# Patient Record
Sex: Male | Born: 1985 | Race: Black or African American | Hispanic: No | Marital: Married | Smoking: Never smoker
Health system: Southern US, Community
[De-identification: ages and names within clinical notes are randomized; demographics above are authoritative.]

## PROBLEM LIST (undated history)

## (undated) HISTORY — PX: ANTERIOR CRUCIATE LIGAMENT REPAIR: SHX115

## (undated) HISTORY — PX: JOINT REPLACEMENT: SHX530

---

## 2007-11-28 ENCOUNTER — Emergency Department: Payer: Self-pay | Admitting: Emergency Medicine

## 2007-12-01 ENCOUNTER — Ambulatory Visit: Payer: Self-pay | Admitting: Specialist

## 2007-12-06 ENCOUNTER — Ambulatory Visit: Payer: Self-pay | Admitting: Specialist

## 2015-04-23 ENCOUNTER — Encounter: Payer: Self-pay | Admitting: Physician Assistant

## 2015-04-23 ENCOUNTER — Ambulatory Visit: Payer: Self-pay | Admitting: Physician Assistant

## 2015-04-23 VITALS — BP 120/80 | HR 82 | Temp 97.7°F | Ht 68.0 in | Wt 239.0 lb

## 2015-04-23 DIAGNOSIS — Z0000 Encounter for general adult medical examination without abnormal findings: Secondary | ICD-10-CM

## 2015-04-23 DIAGNOSIS — Z299 Encounter for prophylactic measures, unspecified: Secondary | ICD-10-CM

## 2015-04-23 NOTE — Progress Notes (Signed)
S: here for yearly physical, no complaints, ros neg, pmhx neg, no meds or allergies, nonsmoker, denies drug and etoh use, married, family hx + dm, htn, breast and "bone marrow" cancer  O: vitals wnl, bp 120/80 on recheck, ENT wnl, neck supple no lymph, lungs c t a, cv rrr, abd soft nontender bs normal all 4 quads, moves all extremeties without difficulty, cn ii-Cii intact, n/v intact  A: well adult physical  P: yearly screening labs to include exec panel, vit d, hiv, rpr, hep c,

## 2015-04-24 LAB — CMP12+LP+TP+TSH+6AC+CBC/D/PLT
A/G RATIO: 1.6 (ref 1.1–2.5)
ALT: 60 IU/L — ABNORMAL HIGH (ref 0–44)
AST: 27 IU/L (ref 0–40)
Albumin: 4.1 g/dL (ref 3.5–5.5)
Alkaline Phosphatase: 63 IU/L (ref 39–117)
BILIRUBIN TOTAL: 0.5 mg/dL (ref 0.0–1.2)
BUN/Creatinine Ratio: 11 (ref 8–19)
BUN: 10 mg/dL (ref 6–20)
Basophils Absolute: 0 10*3/uL (ref 0.0–0.2)
Basos: 0 %
CHLORIDE: 103 mmol/L (ref 96–106)
Calcium: 9.2 mg/dL (ref 8.7–10.2)
Chol/HDL Ratio: 3.6 ratio units (ref 0.0–5.0)
Cholesterol, Total: 139 mg/dL (ref 100–199)
Creatinine, Ser: 0.89 mg/dL (ref 0.76–1.27)
EOS (ABSOLUTE): 0.1 10*3/uL (ref 0.0–0.4)
EOS: 1 %
Estimated CHD Risk: 0.6 times avg. (ref 0.0–1.0)
FREE THYROXINE INDEX: 2.3 (ref 1.2–4.9)
GFR calc Af Amer: 135 mL/min/{1.73_m2} (ref >59)
GFR calc non Af Amer: 116 mL/min/{1.73_m2} (ref >59)
GGT: 45 IU/L (ref 0–65)
GLUCOSE: 75 mg/dL (ref 65–99)
Globulin, Total: 2.6 g/dL (ref 1.5–4.5)
HDL: 39 mg/dL — ABNORMAL LOW (ref >39)
HEMOGLOBIN: 14.7 g/dL (ref 12.6–17.7)
Hematocrit: 43.5 % (ref 37.5–51.0)
Immature Grans (Abs): 0.1 10*3/uL (ref 0.0–0.1)
Immature Granulocytes: 2 %
Iron: 118 ug/dL (ref 38–169)
LDH: 167 IU/L (ref 121–224)
LDL CALC: 88 mg/dL (ref 0–99)
LYMPHS ABS: 2.4 10*3/uL (ref 0.7–3.1)
Lymphs: 36 %
MCH: 27.6 pg (ref 26.6–33.0)
MCHC: 33.8 g/dL (ref 31.5–35.7)
MCV: 82 fL (ref 79–97)
MONOCYTES: 6 %
MONOS ABS: 0.4 10*3/uL (ref 0.1–0.9)
NEUTROS PCT: 55 %
Neutrophils Absolute: 3.7 10*3/uL (ref 1.4–7.0)
PHOSPHORUS: 3.1 mg/dL (ref 2.5–4.5)
PLATELETS: 238 10*3/uL (ref 150–379)
Potassium: 4.4 mmol/L (ref 3.5–5.2)
RBC: 5.33 x10E6/uL (ref 4.14–5.80)
RDW: 13.3 % (ref 12.3–15.4)
SODIUM: 142 mmol/L (ref 134–144)
T3 UPTAKE RATIO: 29 % (ref 24–39)
T4, Total: 8.1 ug/dL (ref 4.5–12.0)
TSH: 2.07 u[IU]/mL (ref 0.450–4.500)
Total Protein: 6.7 g/dL (ref 6.0–8.5)
Triglycerides: 61 mg/dL (ref 0–149)
URIC ACID: 6 mg/dL (ref 3.7–8.6)
VLDL CHOLESTEROL CAL: 12 mg/dL (ref 5–40)
WBC: 6.7 10*3/uL (ref 3.4–10.8)

## 2015-04-24 LAB — HEP, RPR, HIV PANEL
HIV Screen 4th Generation wRfx: NONREACTIVE
Hepatitis B Surface Ag: NEGATIVE
RPR: NONREACTIVE

## 2015-04-24 LAB — VITAMIN D 25 HYDROXY (VIT D DEFICIENCY, FRACTURES): Vit D, 25-Hydroxy: 22.5 ng/mL — ABNORMAL LOW (ref 30.0–100.0)

## 2015-04-25 NOTE — Progress Notes (Signed)
I spoke with the patient about his lab results and he expressed understanding. Blood type results are still pending.

## 2015-04-29 ENCOUNTER — Encounter: Payer: Self-pay | Admitting: Emergency Medicine

## 2015-06-18 ENCOUNTER — Encounter: Payer: Self-pay | Admitting: Family Medicine

## 2016-03-17 ENCOUNTER — Ambulatory Visit: Payer: Self-pay | Admitting: Physician Assistant

## 2016-03-17 ENCOUNTER — Encounter: Payer: Self-pay | Admitting: Physician Assistant

## 2016-03-17 VITALS — BP 130/80 | HR 66 | Temp 97.8°F | Ht 67.0 in | Wt 234.0 lb

## 2016-03-17 DIAGNOSIS — Z0000 Encounter for general adult medical examination without abnormal findings: Secondary | ICD-10-CM

## 2016-03-17 NOTE — Addendum Note (Signed)
Addended by: Catha BrowEACON, MONIQUE T on: 03/17/2016 03:54 PM   Modules accepted: Orders

## 2016-03-17 NOTE — Progress Notes (Signed)
S: here for wellness exam and biometrics, no complaints, ros negative, married, nonsmoker, no etoh or drug use, no meds, nkda, family hx + htn, dm, maternal side of family has breast cancer, both sides have bone marrow cancer  O: vitals wnl, nad, normocephalic, perrl eomi, tms clear, nasal mucosa wnl, throat wnl, neck supple no lymph, lungs c t a, cv rrr, abd soft nontender bs normal all 4 quads, moves all extremeties without difficulty, walks easily and  without limp, n/v intact  A: well adult  P: labs for exec panel, vit d, hiv/hep screening, f/u prn, if glucose is elevated will add A1C

## 2016-03-18 LAB — CMP12+LP+TP+TSH+6AC+CBC/D/PLT
ALBUMIN: 4.4 g/dL (ref 3.5–5.5)
ALT: 39 IU/L (ref 0–44)
AST: 26 IU/L (ref 0–40)
Albumin/Globulin Ratio: 1.5 (ref 1.2–2.2)
Alkaline Phosphatase: 70 IU/L (ref 39–117)
BASOS ABS: 0 10*3/uL (ref 0.0–0.2)
BASOS: 0 %
BUN/Creatinine Ratio: 19 (ref 9–20)
BUN: 18 mg/dL (ref 6–20)
Bilirubin Total: 0.7 mg/dL (ref 0.0–1.2)
CALCIUM: 9.1 mg/dL (ref 8.7–10.2)
CHLORIDE: 102 mmol/L (ref 96–106)
CHOLESTEROL TOTAL: 191 mg/dL (ref 100–199)
Chol/HDL Ratio: 4.7 ratio units (ref 0.0–5.0)
Creatinine, Ser: 0.96 mg/dL (ref 0.76–1.27)
EOS (ABSOLUTE): 0.1 10*3/uL (ref 0.0–0.4)
ESTIMATED CHD RISK: 0.9 times avg. (ref 0.0–1.0)
Eos: 2 %
FREE THYROXINE INDEX: 1.8 (ref 1.2–4.9)
GFR calc Af Amer: 123 mL/min/{1.73_m2} (ref >59)
GFR calc non Af Amer: 106 mL/min/{1.73_m2} (ref >59)
GGT: 59 IU/L (ref 0–65)
GLOBULIN, TOTAL: 2.9 g/dL (ref 1.5–4.5)
Glucose: 66 mg/dL (ref 65–99)
HDL: 41 mg/dL (ref >39)
Hematocrit: 43.5 % (ref 37.5–51.0)
Hemoglobin: 14.4 g/dL (ref 13.0–17.7)
IMMATURE GRANULOCYTES: 1 %
IRON: 101 ug/dL (ref 38–169)
Immature Grans (Abs): 0 10*3/uL (ref 0.0–0.1)
LDH: 198 IU/L (ref 121–224)
LDL Calculated: 127 mg/dL — ABNORMAL HIGH (ref 0–99)
LYMPHS ABS: 2 10*3/uL (ref 0.7–3.1)
Lymphs: 32 %
MCH: 27.3 pg (ref 26.6–33.0)
MCHC: 33.1 g/dL (ref 31.5–35.7)
MCV: 82 fL (ref 79–97)
MONOS ABS: 0.4 10*3/uL (ref 0.1–0.9)
Monocytes: 7 %
NEUTROS PCT: 58 %
Neutrophils Absolute: 3.7 10*3/uL (ref 1.4–7.0)
PLATELETS: 213 10*3/uL (ref 150–379)
Phosphorus: 3 mg/dL (ref 2.5–4.5)
Potassium: 4.2 mmol/L (ref 3.5–5.2)
RBC: 5.28 x10E6/uL (ref 4.14–5.80)
RDW: 13.4 % (ref 12.3–15.4)
SODIUM: 142 mmol/L (ref 134–144)
T3 UPTAKE RATIO: 24 % (ref 24–39)
T4 TOTAL: 7.4 ug/dL (ref 4.5–12.0)
TOTAL PROTEIN: 7.3 g/dL (ref 6.0–8.5)
TRIGLYCERIDES: 114 mg/dL (ref 0–149)
TSH: 2.71 u[IU]/mL (ref 0.450–4.500)
Uric Acid: 6.5 mg/dL (ref 3.7–8.6)
VLDL Cholesterol Cal: 23 mg/dL (ref 5–40)
WBC: 6.3 10*3/uL (ref 3.4–10.8)

## 2016-03-18 LAB — VITAMIN D 25 HYDROXY (VIT D DEFICIENCY, FRACTURES): Vit D, 25-Hydroxy: 26.1 ng/mL — ABNORMAL LOW (ref 30.0–100.0)

## 2016-03-18 LAB — HCV COMMENT:

## 2016-03-18 LAB — HEPATITIS C ANTIBODY (REFLEX)

## 2016-03-18 LAB — HIV ANTIBODY (ROUTINE TESTING W REFLEX): HIV SCREEN 4TH GENERATION: NONREACTIVE

## 2016-03-23 ENCOUNTER — Encounter: Payer: Self-pay | Admitting: Emergency Medicine

## 2016-08-07 ENCOUNTER — Emergency Department
Admission: EM | Admit: 2016-08-07 | Discharge: 2016-08-07 | Disposition: A | Discharge status: Home / Self Care | Payer: Managed Care, Other (non HMO) | Attending: Emergency Medicine | Admitting: Emergency Medicine

## 2016-08-07 ENCOUNTER — Encounter: Payer: Self-pay | Admitting: Medical Oncology

## 2016-08-07 ENCOUNTER — Emergency Department: Payer: Managed Care, Other (non HMO)

## 2016-08-07 DIAGNOSIS — R079 Chest pain, unspecified: Secondary | ICD-10-CM | POA: Insufficient documentation

## 2016-08-07 LAB — BASIC METABOLIC PANEL
Anion gap: 6 (ref 5–15)
BUN: 11 mg/dL (ref 6–20)
CO2: 27 mmol/L (ref 22–32)
CREATININE: 1.06 mg/dL (ref 0.61–1.24)
Calcium: 9.5 mg/dL (ref 8.9–10.3)
Chloride: 104 mmol/L (ref 101–111)
GFR calc Af Amer: >60 mL/min (ref >60)
GFR calc non Af Amer: >60 mL/min (ref >60)
GLUCOSE: 109 mg/dL — AB (ref 65–99)
Potassium: 3.9 mmol/L (ref 3.5–5.1)
SODIUM: 137 mmol/L (ref 135–145)

## 2016-08-07 LAB — CBC
HCT: 42.8 % (ref 40.0–52.0)
Hemoglobin: 14.6 g/dL (ref 13.0–18.0)
MCH: 27.7 pg (ref 26.0–34.0)
MCHC: 34.2 g/dL (ref 32.0–36.0)
MCV: 81 fL (ref 80.0–100.0)
PLATELETS: 209 10*3/uL (ref 150–440)
RBC: 5.29 MIL/uL (ref 4.40–5.90)
RDW: 13 % (ref 11.5–14.5)
WBC: 9.4 10*3/uL (ref 3.8–10.6)

## 2016-08-07 LAB — TROPONIN I
Troponin I: <0.03 ng/mL (ref <0.03)
Troponin I: <0.03 ng/mL (ref <0.03)

## 2016-08-07 MED ORDER — ONDANSETRON HCL 4 MG/2ML IJ SOLN
4.0000 mg | Freq: Once | INTRAMUSCULAR | Status: AC
  Administered 2016-08-07: 4 mg via INTRAVENOUS
  Filled 2016-08-07: qty 2

## 2016-08-07 MED ORDER — IOPAMIDOL (ISOVUE-370) INJECTION 76%
100.0000 mL | Freq: Once | INTRAVENOUS | Status: AC | PRN
  Administered 2016-08-07: 100 mL via INTRAVENOUS

## 2016-08-07 MED ORDER — ONDANSETRON HCL 4 MG PO TABS: 4.0000 mg | ORAL_TABLET | Freq: Three times a day (TID) | ORAL | 0 refills | Status: DC | PRN

## 2016-08-07 MED ORDER — ASPIRIN 81 MG PO CHEW
324.0000 mg | CHEWABLE_TABLET | Freq: Once | ORAL | Status: AC
  Administered 2016-08-07: 324 mg via ORAL
  Filled 2016-08-07: qty 4

## 2016-08-07 MED ORDER — NITROGLYCERIN 0.4 MG SL SUBL
0.4000 mg | SUBLINGUAL_TABLET | SUBLINGUAL | Status: DC | PRN
  Administered 2016-08-07 (×2): 0.4 mg via SUBLINGUAL
  Filled 2016-08-07 (×2): qty 1

## 2016-08-07 MED ORDER — GI COCKTAIL ~~LOC~~
30.0000 mL | ORAL | Status: AC
  Administered 2016-08-07: 30 mL via ORAL
  Filled 2016-08-07: qty 30

## 2016-08-07 MED ORDER — FAMOTIDINE 20 MG PO TABS
40.0000 mg | ORAL_TABLET | Freq: Once | ORAL | Status: AC
  Administered 2016-08-07: 40 mg via ORAL
  Filled 2016-08-07: qty 2

## 2016-08-07 NOTE — ED Notes (Signed)
Pt eyes are red

## 2016-08-07 NOTE — ED Provider Notes (Signed)
[facility] Emergency Department Provider Note  ____________________________________________  Time seen: Approximately 2:43 PM  I have reviewed the triage vital signs and the nursing notes.   HISTORY  Chief Complaint Chest Pain    HPI Jason Mason is a 31 y.o. male who complains of central chest pain that started at about 12:00 PM today. Nonradiating, not associated with shortness of breath vomiting or diaphoresis. He has felt like he has dyspnea on exertion since then. Chest pain is described as heaviness in the center of the chest, intermittent lasting less than a minute at a time. No aggravating or alleviating factors. Rates it as severe, 8 out of 10. No dizziness or syncope. Never had pain like this before. No known medical history. Doesn't smoke.  Patient has been taking a weight loss supplement for the past 2 weeks. This morning he is also had a chicken biscuit fries Mountain Dew protein bar yogurt almonds and lasagna to eat.     History reviewed. No pertinent past medical history.   There are no active problems to display for this patient.    History reviewed. No pertinent surgical history.   Prior to Admission medications   Not on File  None  Allergies Patient has no known allergies.   Family History  Problem Relation Age of Onset  . Diabetes Mother   . Diabetes Father   . Hypertension Father   . Cancer Maternal Grandmother        bone marrow cancer  . Cancer Maternal Grandfather        bone marrow cancer    Social History Social History  Substance Use Topics  . Smoking status: Never Smoker  . Smokeless tobacco: Never Used  . Alcohol use No    Review of Systems  Constitutional:   No fever or chills.  ENT:   No sore throat. No rhinorrhea. Cardiovascular:   Positive chest pain as above. No syncope. Respiratory:   No dyspnea or cough. Gastrointestinal:   Negative for abdominal pain, vomiting and diarrhea.   Musculoskeletal:   Negative for focal pain or swelling All other systems reviewed and are negative except as documented above in ROS and HPI.  ____________________________________________   PHYSICAL EXAM:  VITAL SIGNS: ED Triage Vitals [08/07/16 1301]  Enc Vitals Group     BP 130/82     Pulse Rate 75     Resp 18     Temp 97.5 F (36.4 C)     Temp Source Oral     SpO2 100 %     Weight 220 lb (99.8 kg)     Height 5\' 8"  (1.727 m)     Head Circumference      Peak Flow      Pain Score 8     Pain Loc      Pain Edu?      Excl. in GC?     Vital signs reviewed, nursing assessments reviewed.   Constitutional:   Alert and oriented. Well appearing and in no distress. Eyes:   No scleral icterus.  EOMI. No nystagmus. No conjunctival pallor. PERRL. ENT   Head:   Normocephalic and atraumatic.   Nose:   No congestion/rhinnorhea.    Mouth/Throat:   MMM, no pharyngeal erythema. No peritonsillar mass.    Neck:   No meningismus. Full ROM Hematological/Lymphatic/Immunilogical:   No cervical lymphadenopathy. Cardiovascular:   RRR. Symmetric bilateral radial and DP pulses.  No murmurs.  Respiratory:   Normal respiratory effort  without tachypnea/retractions. Breath sounds are clear and equal bilaterally. No wheezes/rales/rhonchi. Gastrointestinal:   Soft and nontender. Non distended. There is no CVA tenderness.  No rebound, rigidity, or guarding. Genitourinary:   deferred Musculoskeletal:   Normal range of motion in all extremities. No joint effusions.  No lower extremity tenderness.  No edema. Neurologic:   Normal speech and language.  Motor grossly intact. No gross focal neurologic deficits are appreciated.  Skin:    Skin is warm, dry and intact. No rash noted.  No petechiae, purpura, or bullae.  ____________________________________________    LABS (pertinent positives/negatives) (all labs ordered are listed, but only abnormal results are displayed) Labs Reviewed  BASIC  METABOLIC PANEL - Abnormal; Notable for the following:       Result Value   Glucose, Bld 109 (*)    All other components within normal limits  CBC  TROPONIN I  TROPONIN I   ____________________________________________   EKG  Interpreted by me First EKG at 12:55 PM, normal sinus rhythm rate of 82, normal axis and intervals. Normal QRS. Slight J-point elevation in anterior and lateral leads consistent with benign early repolarization pattern  Repeat EKG performed at 2:14 PM, normal sinus rhythm rate of 71, normal axis and intervals. Normal QRS. No ST changes. Now identified T-wave inversion in 3 aVF and V6. Nursing notes lead placement is different compared to the first EKG today.  Repeat EKG at 2:42 PM, sinus rhythm rate of 69, no interval change. Persistent inferior T wave inversions. No acute ST changes.  ____________________________________________    RADIOLOGY  Dg Chest 2 View  Result Date: 08/07/2016 CLINICAL DATA:  Centralized chest pain. EXAM: CHEST  2 VIEW COMPARISON:  None. FINDINGS: Normal cardiac silhouette and mediastinal contours. No focal parenchymal opacities. No pleural effusion or pneumothorax. No evidence of edema. No acute osseus abnormalities IMPRESSION: No active cardiopulmonary disease. Electronically Signed   By: Simonne Come M.D.   On: 08/07/2016 14:20    ____________________________________________   PROCEDURES Procedures  ____________________________________________   INITIAL IMPRESSION / ASSESSMENT AND PLAN / ED COURSE  Pertinent labs & imaging results that were available during my care of the patient were reviewed by me and considered in my medical decision making (see chart for details).  Patient presents with nonspecific chest pain.  Considering the patient's symptoms, medical history, and physical examination today, I have low suspicion for ACS, PE, TAD, pneumothorax, carditis, mediastinitis, pneumonia, CHF, or sepsis.  I suspect gastritis  and acid reflux is the most likely cause of his symptoms. Because of his discomfort we'll give him nitroglycerin and GI cocktail and Pepcid while checking a delta troponin. There is some T-wave changes between first and second EKG which may be technique related. No evolution between second and third EKG.   gaze be signed out to oncoming physician at 3:00 PM pending troponin at 4 PM. Would also obtain a repeat EKG at that time. Heart score is low risk, with follow-up with primary care and continue Pepcid if repeat EKG and troponin are unremarkable.   ____________________________________________   FINAL CLINICAL IMPRESSION(S) / ED DIAGNOSES  Final diagnoses:  Nonspecific chest pain      New Prescriptions   No medications on file     Portions of this note were generated with dragon dictation software. Dictation errors may occur despite best attempts at proofreading.    Sharman Cheek, MD 08/07/16 [phone]

## 2016-08-07 NOTE — Discharge Instructions (Signed)
You were evaluated for chest discomfort, and although no certain cause was found you examine evaluation are reassuring in the emergency department today.  Return to the emergency department immediately for any worsening chest pain, nausea, sweats, dizziness, passing out, confusion altered mental status, or any other symptoms concerning to you.  You need to see a cardiologist, call Dr. Milta DeitersKhan's office on Monday to make an appointment for Monday.

## 2016-08-07 NOTE — ED Provider Notes (Signed)
[facility]  I accepted care from Dr. Scotty CourtStafford ____________________________________________    LABS (pertinent positives/negatives)  Labs Reviewed  BASIC METABOLIC PANEL - Abnormal; Notable for the following:       Result Value   Glucose, Bld 109 (*)    All other components within normal limits  CBC  TROPONIN I  TROPONIN I     ____________________________________________   ECG:  68 bpm. Normal sinus rhythm. Narrow QRS. Normal axis. T waves inverted in 3 and aVF. As well as T-wave inversion/flattening in V6. No ST segment elevation. No change from previous 2 EKGs from today with Dr. Scotty CourtStafford   CT abdomen and pelvis with contrast angiography:  IMPRESSION: Mild chronic changes as described above. No acute aortic abnormality is noted.  No acute abnormality seen. ____________________________________________   PROCEDURES  Procedure(s) performed: None  Critical Care performed: None  ____________________________________________   INITIAL IMPRESSION / ASSESSMENT AND PLAN / ED COURSE   Pertinent labs & imaging results that were available during my care of the patient were reviewed by me and considered in my medical decision making (see chart for details).  Repeat troponin is 0.03. EKG is unchanged from earlier EKGs done in the room. There are some nonspecific findings with T-wave inversions. Patient states he's having continued to get out of 10 pain. GI cocktail did help some.  I discussed with him risks versus benefit of obtaining a CT to rule out dissection, although unlikely, would certainly be emergent.  CT reassuring.  Discussed with Dr. Welton FlakesKhan, cardiology, patient will be following up early this week. Okay for outpatient management at this point in time.   CONSULTATIONS: Dr. Welton FlakesKhan, Cardiology, by phone.  Monday 10:30am appt.    Patient / Family / Caregiver informed of clinical course, medical decision-making process, and agree with plan.   I  discussed return precautions, follow-up instructions, and discharged instructions with patient and/or family.  Discharge instructions:  You were evaluated for chest discomfort, and although no certain cause was found you examine evaluation are reassuring in the emergency department today.  Return to the emergency department immediately for any worsening chest pain, nausea, sweats, dizziness, passing out, confusion altered mental status, or any other symptoms concerning to you.  You need to see a cardiologist, call Dr. Milta DeitersKhan's office on Monday to make an appointment for Monday.    ____________________________________________   FINAL CLINICAL IMPRESSION(S) / ED DIAGNOSES  Final diagnoses:  Nonspecific chest pain        Governor RooksLord, Ameah Chanda, MD 08/07/16 1910

## 2016-08-07 NOTE — ED Triage Notes (Addendum)
Pt reports central chest pain that began 1 hr pta. Pt reports sob also. Pt has been for the past 2 months taking "dialen" which is a fat loss supplement.

## 2016-08-07 NOTE — ED Notes (Addendum)
Pt states having intermittent double vision starting at 11:45 this am

## 2016-08-07 NOTE — ED Notes (Signed)
Pt. Going home with family. 

## 2016-08-07 NOTE — ED Notes (Signed)
MD at bedside. 

## 2017-03-21 ENCOUNTER — Encounter: Payer: Self-pay | Admitting: Physician Assistant

## 2017-03-21 ENCOUNTER — Ambulatory Visit: Payer: Self-pay | Admitting: Physician Assistant

## 2017-03-21 VITALS — BP 120/79 | HR 76 | Temp 98.5°F | Resp 16 | Ht 69.0 in | Wt 229.0 lb

## 2017-03-21 DIAGNOSIS — M7022 Olecranon bursitis, left elbow: Secondary | ICD-10-CM

## 2017-03-21 DIAGNOSIS — Z0000 Encounter for general adult medical examination without abnormal findings: Secondary | ICD-10-CM

## 2017-03-21 NOTE — Addendum Note (Signed)
Addended by: Catha BrowEACON, MONIQUE T on: 03/21/2017 01:58 PM   Modules accepted: Orders

## 2017-03-21 NOTE — Progress Notes (Signed)
   Subjective: Annual physical     Patient ID: Jason RiggsBradford K Mason, male    DOB: 1985-07-16, 32 y.o.   MRN: [id number]  HPI patient presents an official boluses no complaints.  Review of Systems Negative    Objective:   Physical Exam HEENT is unremarkable. Neck is supple without adenopathy. Lungs clear to auscultation heart regular rate and rhythm. Normal active bowel sounds. Negative HSM. Abdomen soft nontender to palpation. Upper and lower extremity shows no obvious deformity. Patient has full and equal range of motion of the upper and lower extremities. No obvious deformity to the cervical or lumbar spine. Patient has full and equal range of motion of the cervical lumbar spine. Patient has negative straight leg test. Patient cranial nerves II through XII grossly intact.       Assessment & Plan: Well exam   Patient will follow-up for lab results.

## 2017-03-22 LAB — CMP12+LP+TP+TSH+6AC+CBC/D/PLT
A/G RATIO: 1.6 (ref 1.2–2.2)
ALK PHOS: 57 IU/L (ref 39–117)
ALT: 23 IU/L (ref 0–44)
AST: 19 IU/L (ref 0–40)
Albumin: 4.6 g/dL (ref 3.5–5.5)
BASOS ABS: 0 10*3/uL (ref 0.0–0.2)
BASOS: 0 %
BILIRUBIN TOTAL: 0.8 mg/dL (ref 0.0–1.2)
BUN / CREAT RATIO: 13 (ref 9–20)
BUN: 14 mg/dL (ref 6–20)
CHLORIDE: 103 mmol/L (ref 96–106)
CREATININE: 1.04 mg/dL (ref 0.76–1.27)
Calcium: 9.4 mg/dL (ref 8.7–10.2)
Chol/HDL Ratio: 3.8 ratio (ref 0.0–5.0)
Cholesterol, Total: 207 mg/dL — ABNORMAL HIGH (ref 100–199)
EOS (ABSOLUTE): 0.1 10*3/uL (ref 0.0–0.4)
EOS: 1 %
ESTIMATED CHD RISK: 0.7 times avg. (ref 0.0–1.0)
FREE THYROXINE INDEX: 1.7 (ref 1.2–4.9)
GFR calc non Af Amer: 96 mL/min/{1.73_m2} (ref >59)
GFR, EST AFRICAN AMERICAN: 111 mL/min/{1.73_m2} (ref >59)
GGT: 46 IU/L (ref 0–65)
GLUCOSE: 89 mg/dL (ref 65–99)
Globulin, Total: 2.8 g/dL (ref 1.5–4.5)
HDL: 54 mg/dL (ref >39)
HEMATOCRIT: 44.5 % (ref 37.5–51.0)
HEMOGLOBIN: 15.4 g/dL (ref 13.0–17.7)
IMMATURE GRANULOCYTES: 0 %
IRON: 135 ug/dL (ref 38–169)
Immature Grans (Abs): 0 10*3/uL (ref 0.0–0.1)
LDH: 187 IU/L (ref 121–224)
LDL CALC: 141 mg/dL — AB (ref 0–99)
Lymphocytes Absolute: 2.2 10*3/uL (ref 0.7–3.1)
Lymphs: 29 %
MCH: 28.1 pg (ref 26.6–33.0)
MCHC: 34.6 g/dL (ref 31.5–35.7)
MCV: 81 fL (ref 79–97)
MONOCYTES: 6 %
Monocytes Absolute: 0.4 10*3/uL (ref 0.1–0.9)
NEUTROS PCT: 64 %
Neutrophils Absolute: 4.8 10*3/uL (ref 1.4–7.0)
PLATELETS: 220 10*3/uL (ref 150–379)
Phosphorus: 2.9 mg/dL (ref 2.5–4.5)
Potassium: 4.5 mmol/L (ref 3.5–5.2)
RBC: 5.48 x10E6/uL (ref 4.14–5.80)
RDW: 13.2 % (ref 12.3–15.4)
SODIUM: 140 mmol/L (ref 134–144)
T3 UPTAKE RATIO: 24 % (ref 24–39)
T4, Total: 6.9 ug/dL (ref 4.5–12.0)
TSH: 2.43 u[IU]/mL (ref 0.450–4.500)
Total Protein: 7.4 g/dL (ref 6.0–8.5)
Triglycerides: 58 mg/dL (ref 0–149)
URIC ACID: 6 mg/dL (ref 3.7–8.6)
VLDL CHOLESTEROL CAL: 12 mg/dL (ref 5–40)
WBC: 7.6 10*3/uL (ref 3.4–10.8)

## 2017-03-22 LAB — VITAMIN D 25 HYDROXY (VIT D DEFICIENCY, FRACTURES): Vit D, 25-Hydroxy: 20.3 ng/mL — ABNORMAL LOW (ref 30.0–100.0)

## 2017-03-22 LAB — SPECIMEN STATUS REPORT

## 2017-12-29 ENCOUNTER — Emergency Department
Admission: EM | Admit: 2017-12-29 | Discharge: 2017-12-29 | Disposition: A | Discharge status: Home / Self Care | Payer: No Typology Code available for payment source | Attending: Emergency Medicine | Admitting: Emergency Medicine

## 2017-12-29 ENCOUNTER — Other Ambulatory Visit: Payer: Self-pay

## 2017-12-29 ENCOUNTER — Emergency Department: Payer: No Typology Code available for payment source

## 2017-12-29 DIAGNOSIS — W010XXA Fall on same level from slipping, tripping and stumbling without subsequent striking against object, initial encounter: Secondary | ICD-10-CM | POA: Insufficient documentation

## 2017-12-29 DIAGNOSIS — S4991XA Unspecified injury of right shoulder and upper arm, initial encounter: Secondary | ICD-10-CM

## 2017-12-29 DIAGNOSIS — Z9660 Presence of unspecified orthopedic joint implant: Secondary | ICD-10-CM | POA: Diagnosis not present

## 2017-12-29 DIAGNOSIS — Y990 Civilian activity done for income or pay: Secondary | ICD-10-CM | POA: Insufficient documentation

## 2017-12-29 DIAGNOSIS — Y92143 Cell of prison as the place of occurrence of the external cause: Secondary | ICD-10-CM | POA: Insufficient documentation

## 2017-12-29 DIAGNOSIS — Y9389 Activity, other specified: Secondary | ICD-10-CM | POA: Diagnosis not present

## 2017-12-29 MED ORDER — IBUPROFEN 600 MG PO TABS: 600.0000 mg | ORAL_TABLET | Freq: Four times a day (QID) | ORAL | 0 refills | Status: AC | PRN
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
## 2017-12-29 NOTE — ED Notes (Signed)
Patient's supervisor, Srgt. Starnes, declines breathalyzer and UDS.
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
## 2018-03-16 ENCOUNTER — Ambulatory Visit: Payer: Self-pay | Admitting: Emergency Medicine

## 2018-03-16 DIAGNOSIS — Z008 Encounter for other general examination: Secondary | ICD-10-CM

## 2018-03-16 DIAGNOSIS — Z0189 Encounter for other specified special examinations: Principal | ICD-10-CM

## 2018-03-16 NOTE — Progress Notes (Signed)
Biometric screening: Subjective: Patient enters for biometric screening.  He is here for blood work.  He has this done on a yearly basis.  No significant complaints at present. Review of systems: Recent treatment for shoulder injury now resolved. Non-smoker, Nondrinker. Exercises regularly. Partner attempt a healthy diet. Objective: Alert cooperative in no distress. HEENT exam unremarkable. Chest clear. Heart regular rate and rhythm. Abdomen without masses. Extremities no edema. Assessment: Patient would benefit from weight loss.  Is currently at 230 pounds.  Will encourage weight loss through diet and exercise.  Positive family history of bone marrow cancer and male cancers.  CBC done last year was normal. Plan: Continue healthy lifestyle. Continue to work on weight loss with exercise.

## 2018-03-17 ENCOUNTER — Telehealth: Payer: Self-pay

## 2018-03-17 LAB — LIPID PANEL WITH LDL/HDL RATIO
Cholesterol, Total: 240 mg/dL — ABNORMAL HIGH (ref 100–199)
HDL: 46 mg/dL (ref >39)
LDL CALC: 181 mg/dL — AB (ref 0–99)
LDL/HDL RATIO: 3.9 ratio — AB (ref 0.0–3.6)
Triglycerides: 66 mg/dL (ref 0–149)
VLDL CHOLESTEROL CAL: 13 mg/dL (ref 5–40)

## 2018-03-17 LAB — GLUCOSE, RANDOM: GLUCOSE: 84 mg/dL (ref 65–99)

## 2018-03-17 NOTE — Telephone Encounter (Signed)
The patient was contacted for his Biometric Lab results from 03/16/18. He was notified of his High Cholesterol levels and the recommended treatment plan of F/U with his PCP and starting a Low fat diet and exercise regiment along with discussing the possibility of being placed on a statin per his PCP. The patient gave verbal understanding.

## 2018-07-04 IMAGING — CR DG CHEST 2V
1 series · 2 of 2 positions shown · non-contrast
Comparison: None.

CLINICAL DATA: Centralized chest pain.

EXAM:
CHEST  2 VIEW

[Series 1: dg chest 2 view · 0.14mm/px · 2 of 2 slices shown]
[im 1/2]
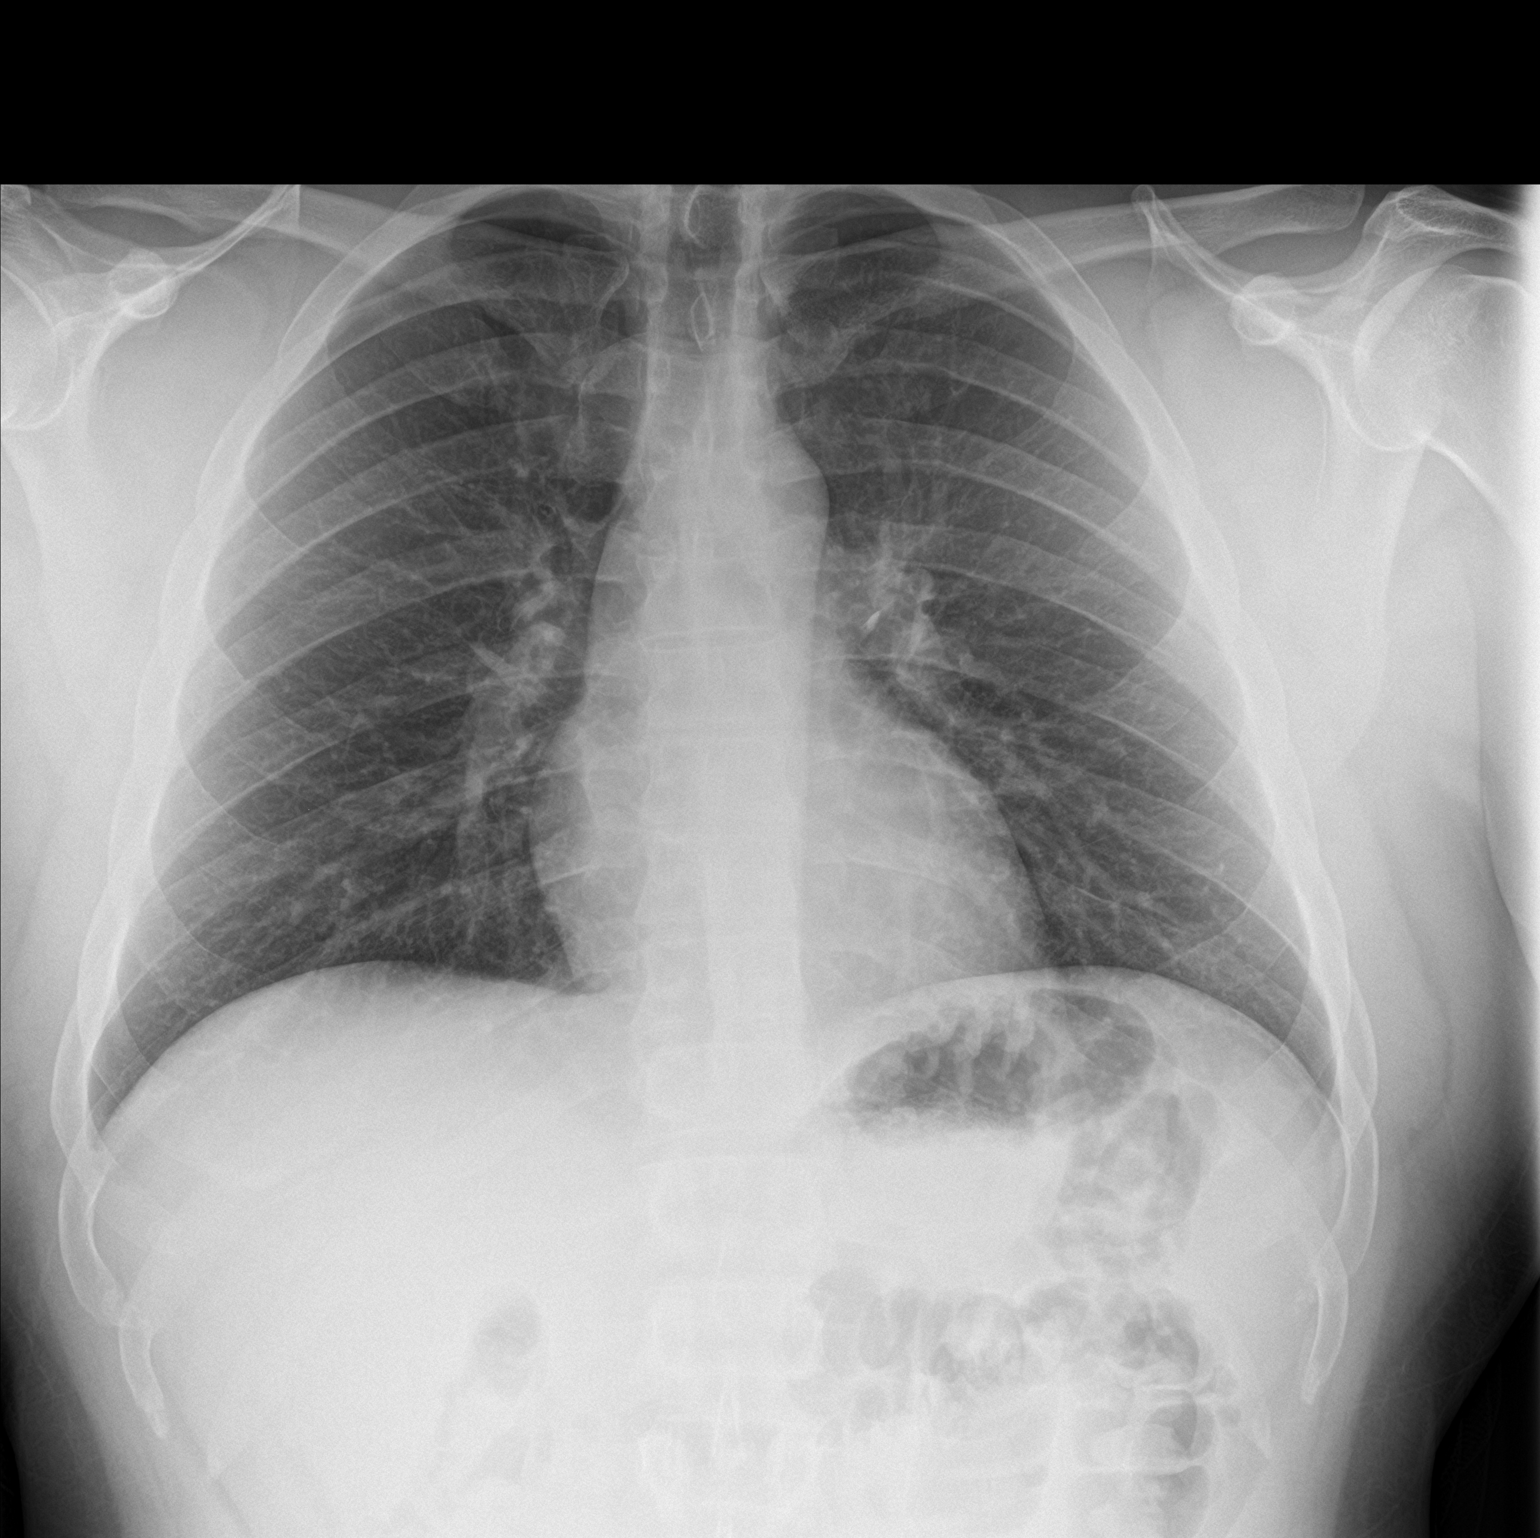
[im 2/2]
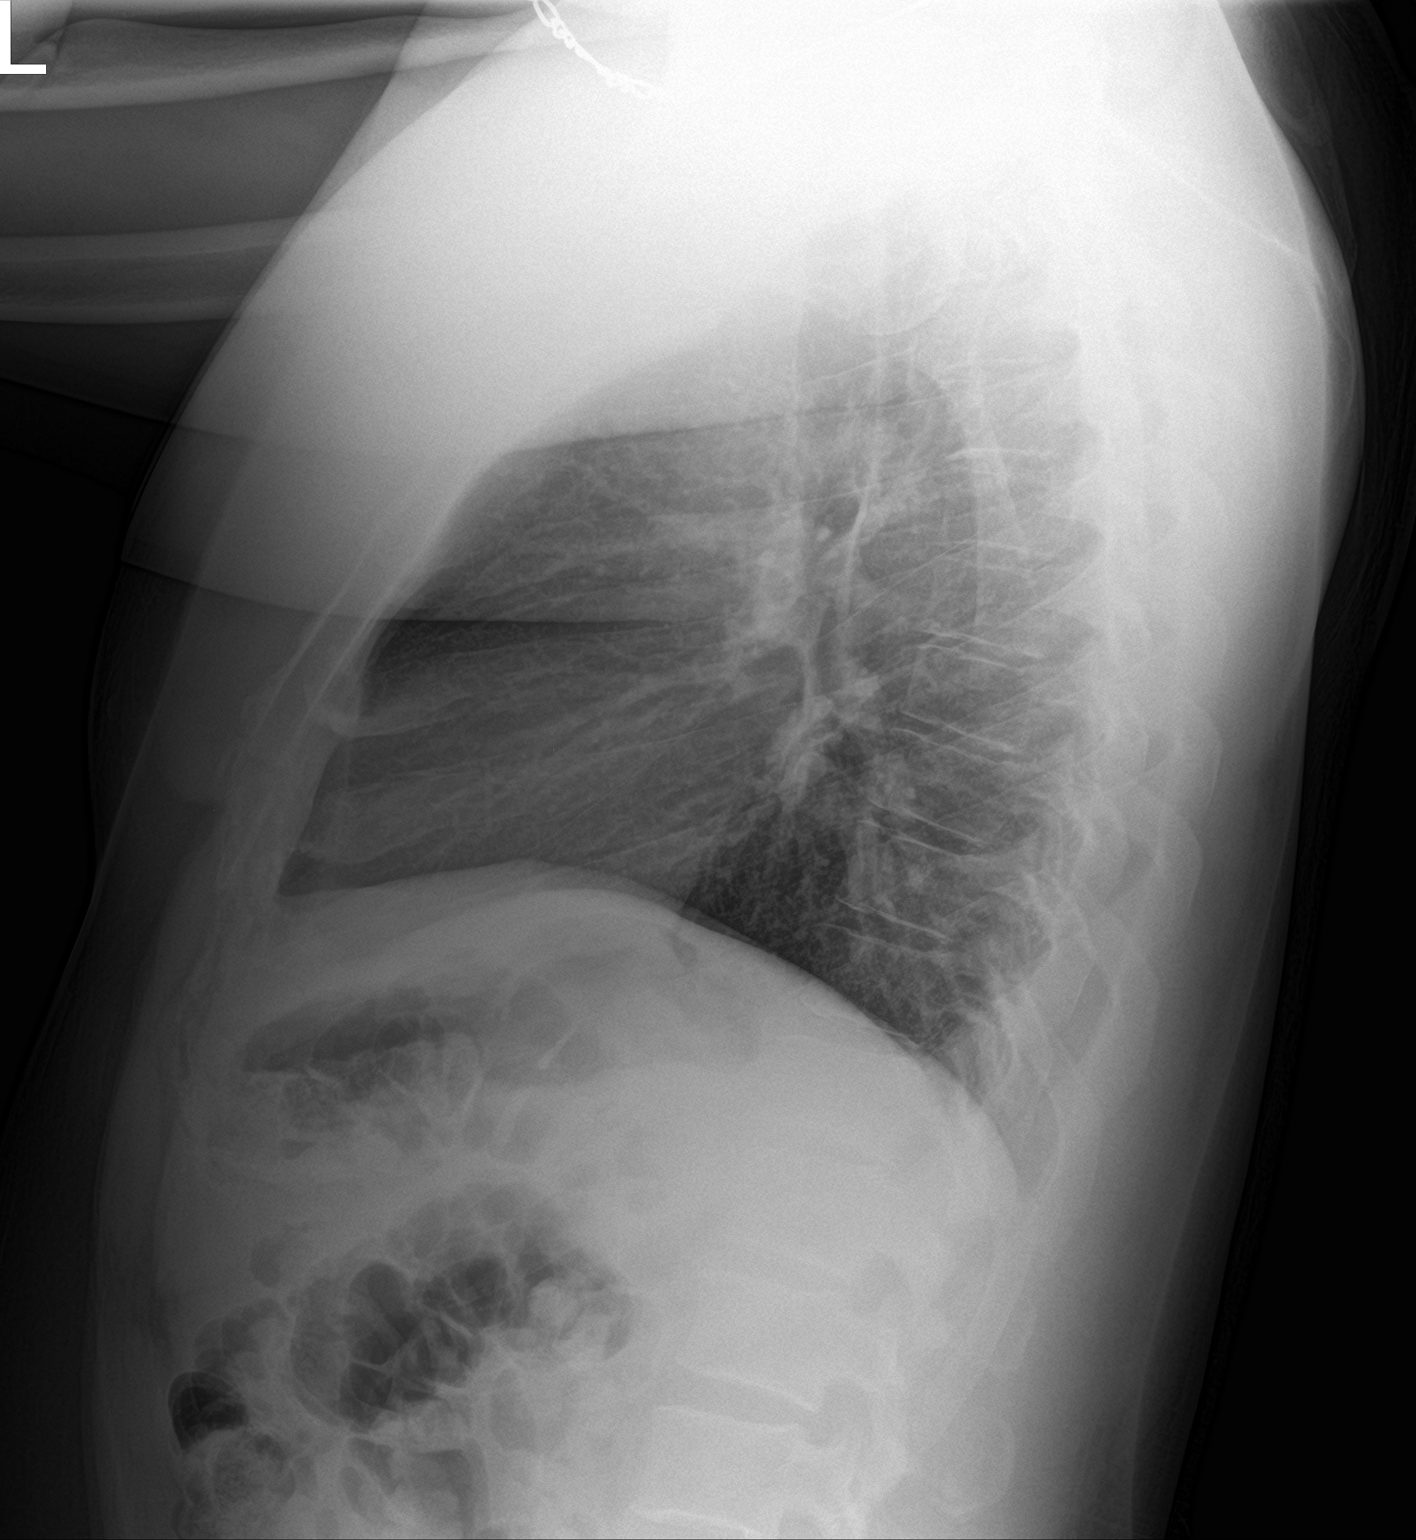

[2 of 2 positions shown; findings below may reference images not displayed]

FINDINGS: Normal cardiac silhouette and mediastinal contours. No focal
parenchymal opacities. No pleural effusion or pneumothorax. No
evidence of edema. No acute osseus abnormalities
IMPRESSION: No active cardiopulmonary disease.

## 2018-07-04 IMAGING — CT CT ANGIO CHEST-ABD-PELV FOR DISSECTION W/ AND WO/W CM
2 of 7 series · 14 of 46 positions shown, 16 images · IV contrast (APPLIED)
Comparison: Chest x-ray from earlier in the same day.

CLINICAL DATA: Chest pain

EXAM:
CT ANGIOGRAPHY CHEST, ABDOMEN AND PELVIS
TECHNIQUE: Multidetector CT imaging through the chest, abdomen and pelvis was
performed using the standard protocol during bolus administration of
intravenous contrast. Multiplanar reconstructed images and MIPs were
obtained and reviewed to evaluate the vascular anatomy.
CONTRAST:  100 mL Isovue 370.

[Series 6: axial arterial · axial · arterial · 0.76mm/px · z∈[-831,-243]mm · 11 of 218 slices shown, 13 images]
[im 11/218  soft-tissue]
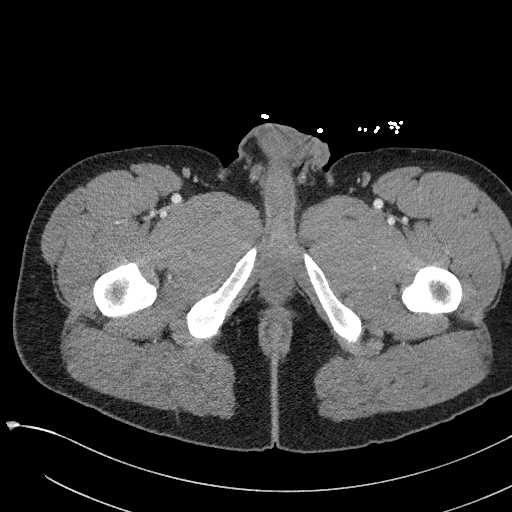
[im 11/218  bone]
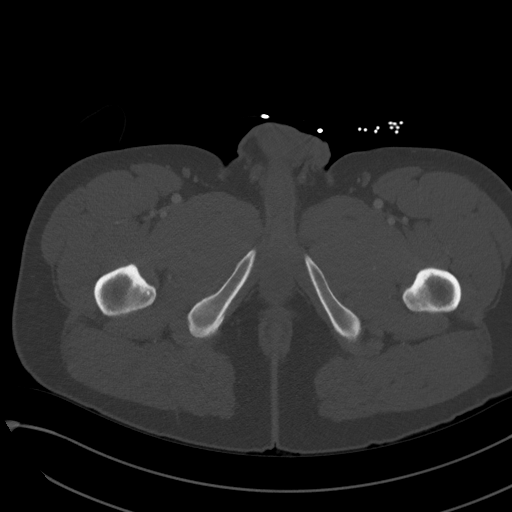
[im 33/218  soft-tissue]
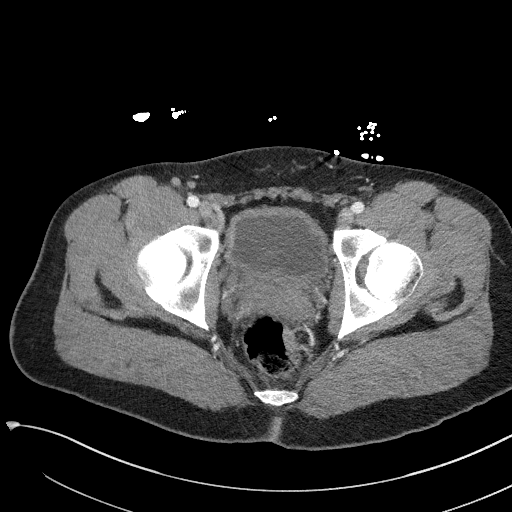
[im 55/218  soft-tissue]
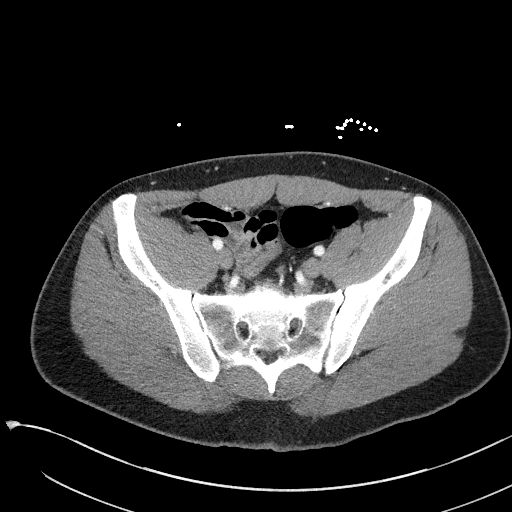
[im 76/218  soft-tissue]
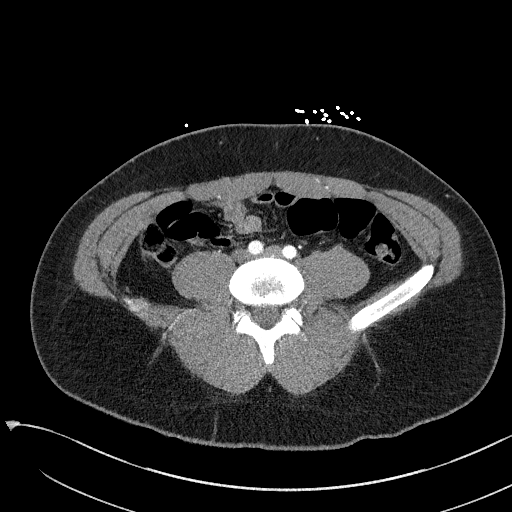
[im 87/218  soft-tissue]
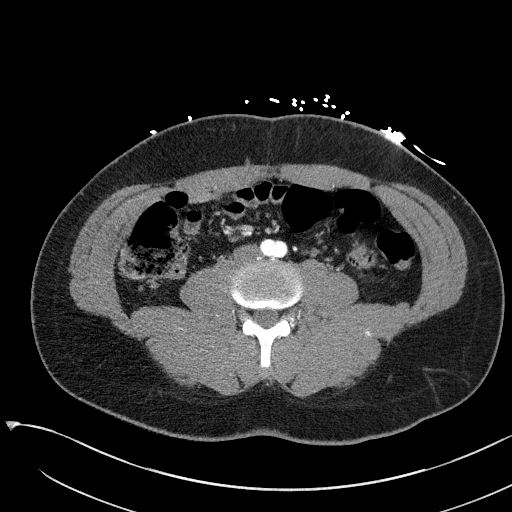
[im 109/218  soft-tissue]
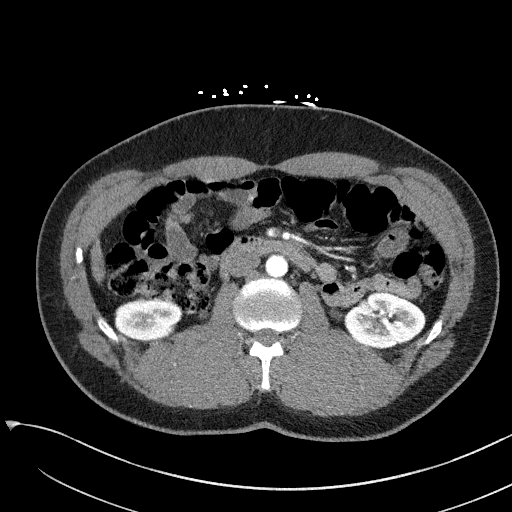
[im 131/218  soft-tissue]
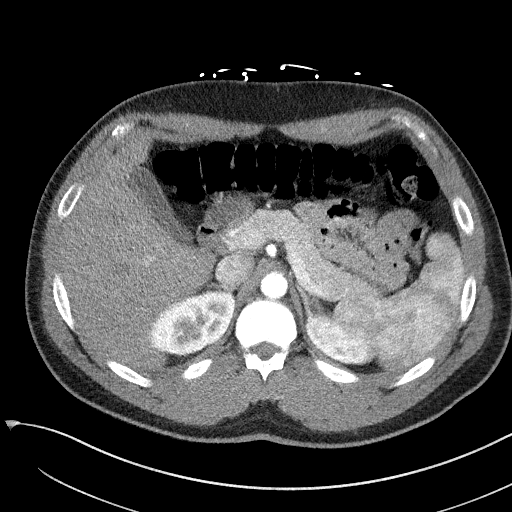
[im 142/218  soft-tissue]
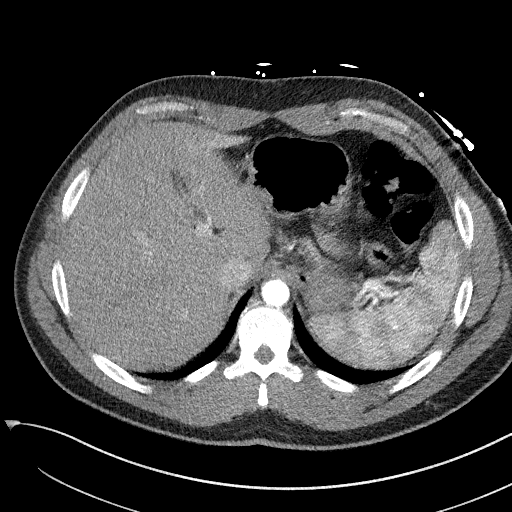
[im 163/218  soft-tissue]
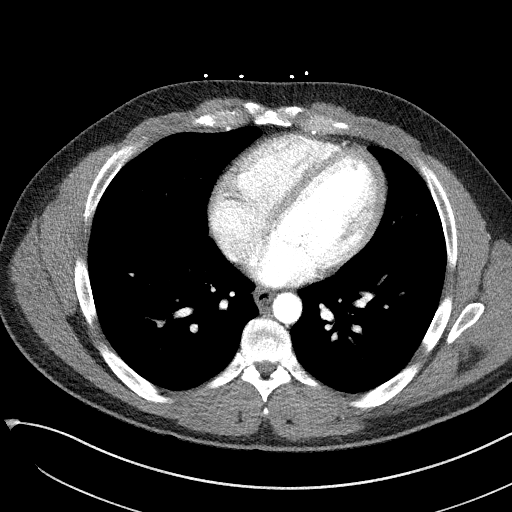
[im 163/218  bone]
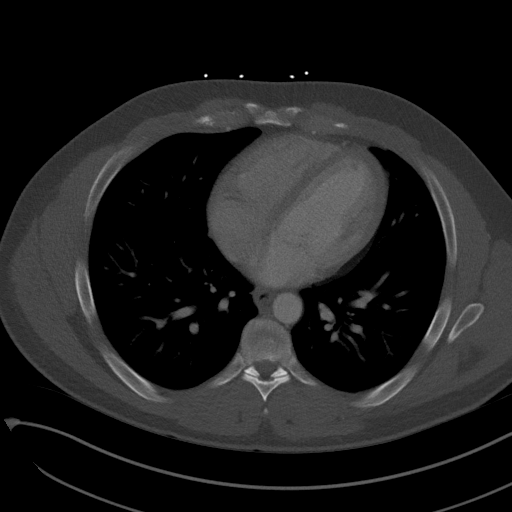
[im 185/218  soft-tissue]
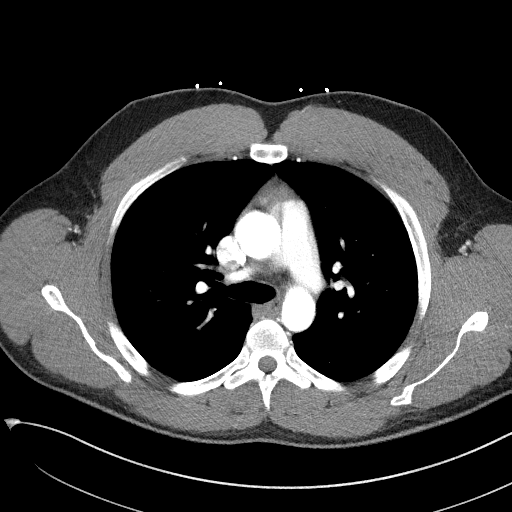
[im 207/218  soft-tissue]
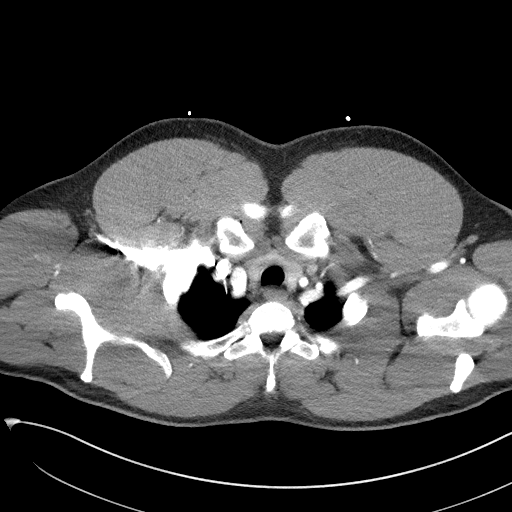

[Series 9: coronals · coronal · 0.71mm/px · 3 of 149 slices shown]
[im 38/149  soft-tissue]
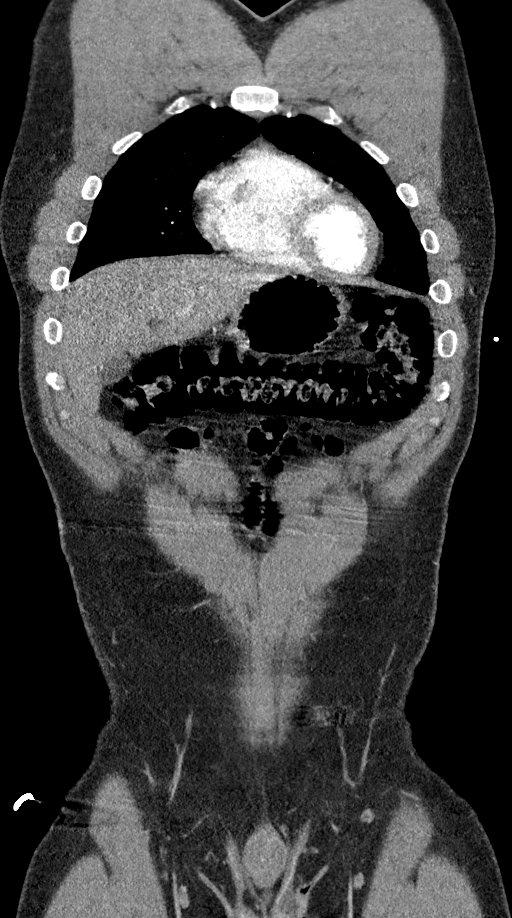
[im 75/149  soft-tissue]
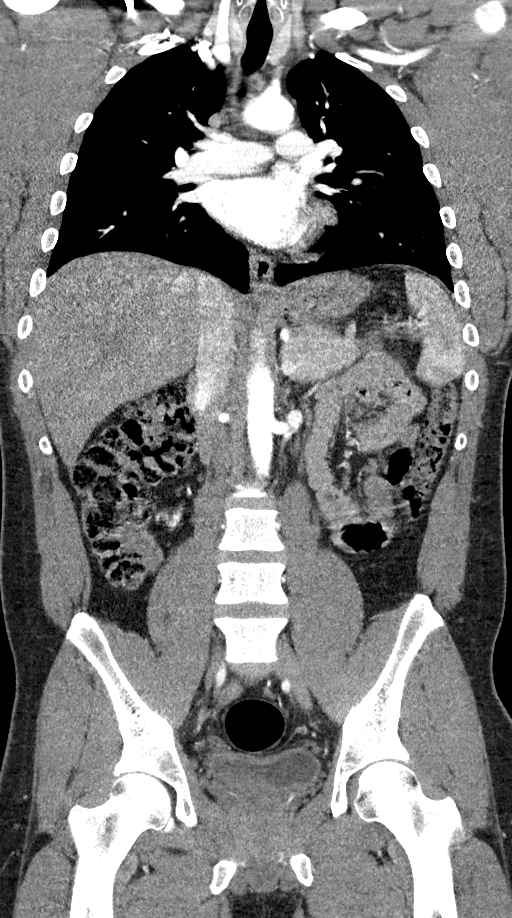
[im 112/149  soft-tissue]
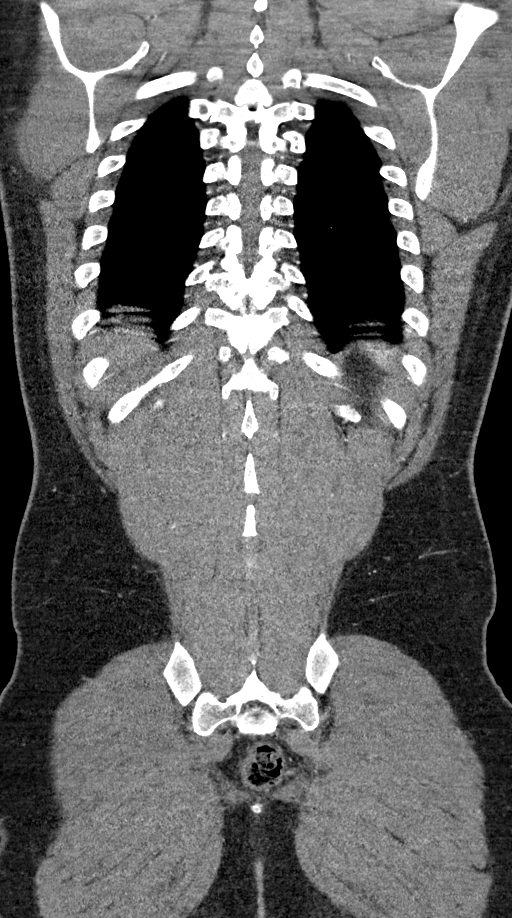

[14 of 46 positions shown; findings below may reference images not displayed]

FINDINGS: CTA CHEST FINDINGS

Cardiovascular: Considerable motion artifact is noted although the
aorta is within normal limits. No dissection or aneurysmal
dilatation is seen. No significant cardiac enlargement is seen.
Although not timed for pulmonary embolism evaluation no large
central embolus is seen. No coronary calcifications are noted.

Mediastinum/Nodes: The thoracic inlet is within normal limits. No
hilar or mediastinal adenopathy is seen. The esophagus as visualized
is within normal limits.

Lungs/Pleura: The lungs are well aerated bilaterally. No focal
infiltrate or sizable effusion is seen.

Musculoskeletal: No acute bony abnormality is noted.

Review of the MIP images confirms the above findings.

CTA ABDOMEN AND PELVIS FINDINGS

VASCULAR

Aorta: Well visualize without aneurysmal dilatation or dissection.

Celiac: Patent without evidence of aneurysm, dissection, vasculitis
or significant stenosis.

SMA: Patent without evidence of aneurysm, dissection, vasculitis or
significant stenosis.

Renals: Both renal arteries are patent without evidence of aneurysm,
dissection, vasculitis, fibromuscular dysplasia or significant
stenosis.

IMA: Patent without evidence of aneurysm, dissection, vasculitis or
significant stenosis.

Iliacs: Patent without evidence of aneurysm, dissection, vasculitis
or significant stenosis.

Veins: Within normal limits.

Review of the MIP images confirms the above findings.

NON-VASCULAR

Hepatobiliary: Fatty infiltration of the liver is noted. The
gallbladder is decompressed.

Pancreas: Unremarkable. No pancreatic ductal dilatation or
surrounding inflammatory changes.

Spleen: Normal in size without focal abnormality.

Adrenals/Urinary Tract: Adrenal glands are unremarkable. Kidneys are
normal, without renal calculi, focal lesion, or hydronephrosis.
Bladder is unremarkable.

Stomach/Bowel: Stomach is within normal limits. Appendix appears
normal. No evidence of bowel wall thickening, distention, or
inflammatory changes.

Lymphatic: No enlarged abdominal or pelvic lymph nodes.

Reproductive: Prostate is unremarkable.

Other: No abdominal wall hernia or abnormality. No abdominopelvic
ascites.

Musculoskeletal: No acute or significant osseous findings.

Review of the MIP images confirms the above findings.
IMPRESSION: Mild chronic changes as described above. No acute aortic abnormality
is noted.

No acute abnormality seen.

## 2019-11-05 ENCOUNTER — Other Ambulatory Visit: Payer: Self-pay

## 2019-11-05 ENCOUNTER — Ambulatory Visit (INDEPENDENT_AMBULATORY_CARE_PROVIDER_SITE_OTHER): Payer: 59 | Admitting: Clinical

## 2019-11-05 DIAGNOSIS — F321 Major depressive disorder, single episode, moderate: Secondary | ICD-10-CM
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
## 2019-12-06 ENCOUNTER — Ambulatory Visit (HOSPITAL_COMMUNITY): Payer: Self-pay | Admitting: Clinical

## 2019-12-07 ENCOUNTER — Ambulatory Visit (HOSPITAL_COMMUNITY): Payer: Self-pay | Admitting: Clinical

## 2020-01-03 ENCOUNTER — Ambulatory Visit (HOSPITAL_COMMUNITY): Payer: Self-pay | Admitting: Clinical

## 2020-02-02 ENCOUNTER — Ambulatory Visit
Admission: EM | Admit: 2020-02-02 | Discharge: 2020-02-02 | Disposition: A | Discharge status: Home / Self Care | Payer: Self-pay | Attending: Family Medicine | Admitting: Family Medicine

## 2020-02-02 ENCOUNTER — Other Ambulatory Visit: Payer: Self-pay

## 2020-02-02 DIAGNOSIS — Z20822 Contact with and (suspected) exposure to covid-19: Secondary | ICD-10-CM

## 2020-02-02 NOTE — ED Triage Notes (Signed)
Needs covid test

## 2020-02-03 LAB — SARS-COV-2, NAA 2 DAY TAT

## 2020-02-03 LAB — NOVEL CORONAVIRUS, NAA: SARS-CoV-2, NAA: NOT DETECTED

## 2020-02-04 ENCOUNTER — Other Ambulatory Visit: Payer: Self-pay

## 2020-07-30 ENCOUNTER — Ambulatory Visit: Payer: Self-pay

## 2020-07-30 ENCOUNTER — Other Ambulatory Visit: Payer: Self-pay

## 2020-07-30 ENCOUNTER — Emergency Department
Admission: EM | Admit: 2020-07-30 | Discharge: 2020-07-31 | Disposition: A | Discharge status: Home / Self Care | Payer: 59 | Attending: Emergency Medicine | Admitting: Emergency Medicine

## 2020-07-30 ENCOUNTER — Encounter: Payer: Self-pay | Admitting: Emergency Medicine

## 2020-07-30 DIAGNOSIS — Z9660 Presence of unspecified orthopedic joint implant: Secondary | ICD-10-CM | POA: Diagnosis not present

## 2020-07-30 DIAGNOSIS — Z20822 Contact with and (suspected) exposure to covid-19: Secondary | ICD-10-CM | POA: Diagnosis not present

## 2020-07-30 DIAGNOSIS — F329 Major depressive disorder, single episode, unspecified: Secondary | ICD-10-CM | POA: Diagnosis not present

## 2020-07-30 DIAGNOSIS — Z008 Encounter for other general examination: Secondary | ICD-10-CM

## 2020-07-30 DIAGNOSIS — Z046 Encounter for general psychiatric examination, requested by authority: Secondary | ICD-10-CM | POA: Diagnosis present

## 2020-07-30 DIAGNOSIS — F330 Major depressive disorder, recurrent, mild: Secondary | ICD-10-CM | POA: Diagnosis present

## 2020-07-30 LAB — URINE DRUG SCREEN, QUALITATIVE (ARMC ONLY)
Amphetamines, Ur Screen: NOT DETECTED
Barbiturates, Ur Screen: NOT DETECTED
Benzodiazepine, Ur Scrn: NOT DETECTED
Cannabinoid 50 Ng, Ur ~~LOC~~: NOT DETECTED
Cocaine Metabolite,Ur ~~LOC~~: NOT DETECTED
MDMA (Ecstasy)Ur Screen: NOT DETECTED
Methadone Scn, Ur: NOT DETECTED
Opiate, Ur Screen: NOT DETECTED
Phencyclidine (PCP) Ur S: NOT DETECTED
Tricyclic, Ur Screen: NOT DETECTED

## 2020-07-30 LAB — COMPREHENSIVE METABOLIC PANEL
ALT: 23 U/L (ref 0–44)
AST: 25 U/L (ref 15–41)
Albumin: 4.8 g/dL (ref 3.5–5.0)
Alkaline Phosphatase: 56 U/L (ref 38–126)
Anion gap: 10 (ref 5–15)
BUN: 12 mg/dL (ref 6–20)
CO2: 24 mmol/L (ref 22–32)
Calcium: 9.1 mg/dL (ref 8.9–10.3)
Chloride: 104 mmol/L (ref 98–111)
Creatinine, Ser: 0.93 mg/dL (ref 0.61–1.24)
GFR, Estimated: >60 mL/min (ref >60)
Glucose, Bld: 129 mg/dL — ABNORMAL HIGH (ref 70–99)
Potassium: 3.8 mmol/L (ref 3.5–5.1)
Sodium: 138 mmol/L (ref 135–145)
Total Bilirubin: 1.1 mg/dL (ref 0.3–1.2)
Total Protein: 8 g/dL (ref 6.5–8.1)

## 2020-07-30 LAB — CBC
HCT: 42.8 % (ref 39.0–52.0)
Hemoglobin: 14.7 g/dL (ref 13.0–17.0)
MCH: 27.9 pg (ref 26.0–34.0)
MCHC: 34.3 g/dL (ref 30.0–36.0)
MCV: 81.4 fL (ref 80.0–100.0)
Platelets: 242 10*3/uL (ref 150–400)
RBC: 5.26 MIL/uL (ref 4.22–5.81)
RDW: 11.9 % (ref 11.5–15.5)
WBC: 9.3 10*3/uL (ref 4.0–10.5)
nRBC: 0 % (ref 0.0–0.2)

## 2020-07-30 LAB — ETHANOL: Alcohol, Ethyl (B): <10 mg/dL (ref <10)

## 2020-07-30 LAB — SALICYLATE LEVEL: Salicylate Lvl: <7 mg/dL — ABNORMAL LOW (ref 7.0–30.0)

## 2020-07-30 LAB — RESP PANEL BY RT-PCR (FLU A&B, COVID) ARPGX2
Influenza A by PCR: NEGATIVE
Influenza B by PCR: NEGATIVE
SARS Coronavirus 2 by RT PCR: NEGATIVE

## 2020-07-30 LAB — ACETAMINOPHEN LEVEL: Acetaminophen (Tylenol), Serum: <10 ug/mL — ABNORMAL LOW (ref 10–30)

## 2020-07-30 NOTE — ED Triage Notes (Addendum)
Pt to ED under IVC by [place] [employer] with IVC paperwork that states patient has undiagnosed mental illness, mentions aggressive behavior towards family, statement to fiance that states "only way i'll leave is in a body bag."  Pt states got in an argument with fiance.  Denies SI/HI or hx of same, denies visual or auditory hallucinations, denies drug or alcohol use.  Pt calm and cooperative in triage.  MSE waiver signature not signed due to patient's IVC status.  Pt dressed into hospital appropriate scrubs by this RN and Officer in room.  Belongings placed into 1 bag and contents include: 1 pair blue jeans, 1 black belt, 1 gray t shirt, 1 pair brown shoes, 1 pair boxers, 1 yellow necklace, 1 white watch, 1 silicone ring.

## 2020-07-30 NOTE — ED Provider Notes (Signed)
[facility] Emergency Department Provider Note  ____________________________________________   None    (approximate)  I have reviewed the triage vital signs and the nursing notes.   HISTORY  Chief Complaint Psychiatric Evaluation    HPI Jason Mason is a 35 y.o. male who is otherwise healthy who is brought in by police for IVC paperwork.  The paperwork states that patient has undiagnosed mental illness, has been more aggressive towards his family.  He denies any SI, HI or auditory or visual hallucinations.  Patient states that he had an argument with his wife.  He states that he would never hurt himself.  He states that he has a lot to live for including his grandma, his wife, his son.  He denies any hallucinations, HI.  He states that it was just a big misunderstanding.            History reviewed. No pertinent past medical history.  There are no problems to display for this patient.   Past Surgical History:  Procedure Laterality Date  . JOINT REPLACEMENT      Prior to Admission medications   Medication Sig Start Date End Date Taking? Authorizing Provider  ibuprofen (ADVIL,MOTRIN) 600 MG tablet Take 1 tablet (600 mg total) by mouth every 6 (six) hours as needed. 12/29/17   Enid Derry, PA-C    Allergies Patient has no known allergies.  Family History  Problem Relation Age of Onset  . Diabetes Mother   . Diabetes Father   . Hypertension Father   . Cancer Maternal Grandmother        bone marrow cancer  . Cancer Maternal Grandfather        bone marrow cancer    Social History Social History   Tobacco Use  . Smoking status: Never Smoker  . Smokeless tobacco: Never Used  Substance Use Topics  . Alcohol use: Yes    Alcohol/week: 0.0 standard drinks  . Drug use: No      Review of Systems Constitutional: No fever/chills Eyes: No visual changes. ENT: No sore throat. Cardiovascular: Denies chest pain. Respiratory:  Denies shortness of breath. Gastrointestinal: No abdominal pain.  No nausea, no vomiting.  No diarrhea.  No constipation. Genitourinary: Negative for dysuria. Musculoskeletal: Negative for back pain. Skin: Negative for rash. Neurological: Negative for headaches, focal weakness or numbness. Psych: Concern for SI from family All other ROS negative ____________________________________________   PHYSICAL EXAM:  VITAL SIGNS: ED Triage Vitals  Enc Vitals Group     BP 07/30/20 2129 134/82     Pulse Rate 07/30/20 2129 78     Resp 07/30/20 2129 16     Temp 07/30/20 2129 98.3 F (36.8 C)     Temp Source 07/30/20 2129 Oral     SpO2 07/30/20 2129 98 %     Weight 07/30/20 2130 210 lb (95.3 kg)     Height 07/30/20 2130 5\' 9"  (1.753 m)     Head Circumference --      Peak Flow --      Pain Score 07/30/20 2129 0     Pain Loc --      Pain Edu? --      Excl. in GC? --     Constitutional: Alert and oriented. Well appearing and in no acute distress. Eyes: Conjunctivae are normal. No swelling around eyes Head: Atraumatic. Nose: No congestion/rhinnorhea. Mouth/Throat: Mucous membranes are moist.   Neck: No stridor. Trachea Midline. FROM Cardiovascular: Normal rate, no swelling  noted Respiratory: No increased wob, no stridor Gastrointestinal: Soft and nontender. No distention. No abdominal bruits.  Musculoskeletal: No lower extremity tenderness nor edema.  No joint effusions. Neurologic:  Normal speech and language. No gross focal neurologic deficits are appreciated.  Skin:  Skin is warm, dry and intact. No rash noted. Psychiatric: No SI, HI GU: Deferred   ____________________________________________   LABS (all labs ordered are listed, but only abnormal results are displayed)  Labs Reviewed  COMPREHENSIVE METABOLIC PANEL - Abnormal; Notable for the following components:      Result Value   Glucose, Bld 129 (*)    All other components within normal limits  SALICYLATE LEVEL -  Abnormal; Notable for the following components:   Salicylate Lvl <7.0 (*)    All other components within normal limits  ACETAMINOPHEN LEVEL - Abnormal; Notable for the following components:   Acetaminophen (Tylenol), Serum <10 (*)    All other components within normal limits  RESP PANEL BY RT-PCR (FLU A&B, COVID) ARPGX2  ETHANOL  CBC  URINE DRUG SCREEN, QUALITATIVE ([facility] ONLY)   ____________________________________________  INITIAL IMPRESSION / ASSESSMENT AND PLAN / ED COURSE  [name] was evaluated in Emergency Department on 07/30/2020 for the symptoms described in the history of present illness. He was evaluated in the context of the global COVID-19 pandemic, which necessitated consideration that the patient might be at risk for infection with the SARS-CoV-2 virus that causes COVID-19. Institutional protocols and algorithms that pertain to the evaluation of patients at risk for COVID-19 are in a state of rapid change based on information released by regulatory bodies including the CDC and federal and state organizations. These policies and algorithms were followed during the patient's care in the ED.    Patient was IVC by family.  Pt is without any acute medical complaints. No exam findings to suggest medical cause of current presentation. Will order psychiatric screening labs and discuss further w/ psychiatric service.  D/d includes but is not limited to psychiatric disease, behavioral/personality disorder, inadequate socioeconomic support, medical.  Based on HPI, exam, unremarkable labs, no concern for acute medical problem at this time. No rigidity, clonus, hyperthermia, focal neurologic deficit, diaphoresis, tachycardia, meningismus, ataxia, gait abnormality or other finding to suggest this visit represents a non-psychiatric problem. Screening labs reviewed.    Given this, pt medically cleared, to be dispositioned per Psych.    The patient has been placed in psychiatric  observation due to the need to provide a safe environment for the patient while obtaining psychiatric consultation and evaluation, as well as ongoing medical and medication management to treat the patient's condition.  The patient has been placed under full IVC at this time.        ____________________________________________   FINAL CLINICAL IMPRESSION(S) / ED DIAGNOSES   Final diagnoses:  Evaluation by psychiatric service required      MEDICATIONS GIVEN DURING THIS VISIT:  Medications - No data to display   ED Discharge Orders    None       Note:  This document was prepared using Dragon voice recognition software and may include unintentional dictation errors.   Concha Se, MD 07/30/20 [phone]

## 2020-07-31 DIAGNOSIS — F330 Major depressive disorder, recurrent, mild: Secondary | ICD-10-CM
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
## 2020-07-31 NOTE — ED Notes (Signed)
IVC, pending AM reassessment

## 2020-07-31 NOTE — Consult Note (Signed)
[facility] Face-to-Face Psychiatry Consult   Reason for Consult: Psychiatric Evaluation Referring Physician: Dr. Fuller PlanFunke Patient Identification: Jason RiggsBradford K Mason MRN:  [id number] Principal Diagnosis: <principal problem not specified> Diagnosis:  Active Problems:   MDD (major depressive disorder), recurrent episode, mild (HCC)   Total Time spent with patient: 20 minutes  Subjective: " I just want to go home and hold my son." Jason Mason is a 35 y.o. male patient presented to  [facility] ED via law enforcement under involuntary commitment status (IVC). The patient is an employee here at [employer], and he works in [employer]. The patient is supposed to be at work this a.m. (05.26.22 @ 0600). I discussed that it would be better for him to take the day off to handle things at home with his family.  It was wrong for law enforcement to bring the patient to his place of employment. The patient should have been taken to another facility. He continues to voice, "I do not want to lose my job. This job is what I rely on to take care of my family." The patient shared that he and his wife got into an argument tonight because he wanted some alone time with her. He shared that things were said in the argument which got out of hand verbally. The patient explained that they have a young son that is not his biological son, but he has raised him since he was [age] years old. The patient discussed that he loves his family and will never do anything to hurt them, himself, or anyone else. He shared that tonight (05.25.22), he became upset, and he blew up without any intention that he would have done something to his wife and child. Per the ED triage nurse note, Pt to ED under IVC by [place] [employer] with IVC paperwork that states patient has undiagnosed mental illness, mentions aggressive behavior towards family, statement to fiance that states "only way i'll leave is in a body bag."  Pt states got in an argument with fiance.  Denies SI/HI  or hx of same, denies visual or auditory hallucinations, denies drug or alcohol use.  Pt calm and cooperative in triage.  MSE waiver signature not signed due to patient's IVC status. The patient was seen face-to-face by this provider; the chart was reviewed and consulted with Dr. Fuller PlanFunke on 07/31/2020 due to the patient's care. It was discussed with the EDP that the patient remained under observation overnight and will be reassessed in the a.m. to determine if he meets the criteria for psychiatric inpatient admission; he could be discharged home. On evaluation, the patient is alert and oriented x 4, calm, cooperative, and mood-congruent with affect.  The patient does not appear to be responding to internal or external stimuli. Neither is the patient presenting with any delusional thinking. The patient denies auditory or visual hallucinations. The patient denies any suicidal, homicidal, or self-harm ideations. The patient is not presenting with any psychotic or paranoid behaviors. During an encounter with the patient, he was able to answer questions appropriately.  HPI: Dr. Fuller PlanFunke, Jason RiggsBradford K Mason is a 35 y.o. male who is otherwise healthy who is brought in by police for IVC paperwork.  The paperwork states that patient has undiagnosed mental illness, has been more aggressive towards his family.  He denies any SI, HI or auditory or visual hallucinations.  Patient states that he had an argument with his wife.  He states that he would never hurt himself.  He states that he has a  lot to live for including his grandma, his wife, his son.  He denies any hallucinations, HI.  He states that it was just a big misunderstanding.  Past Psychiatric History: No pertinent past psychiatric history  Risk to Self:   Risk to Others:   Prior Inpatient Therapy:   Prior Outpatient Therapy:    Past Medical History: History reviewed. No pertinent past medical history.  Past Surgical History:  Procedure Laterality Date  .  JOINT REPLACEMENT     Family History:  Family History  Problem Relation Age of Onset  . Diabetes Mother   . Diabetes Father   . Hypertension Father   . Cancer Maternal Grandmother        bone marrow cancer  . Cancer Maternal Grandfather        bone marrow cancer   Family Psychiatric  History:  Social History:  Social History   Substance and Sexual Activity  Alcohol Use Yes  . Alcohol/week: 0.0 standard drinks     Social History   Substance and Sexual Activity  Drug Use No    Social History   Socioeconomic History  . Marital status: Married    Spouse name: Not on file  . Number of children: Not on file  . Years of education: Not on file  . Highest education level: Not on file  Occupational History  . Not on file  Tobacco Use  . Smoking status: Never Smoker  . Smokeless tobacco: Never Used  Substance and Sexual Activity  . Alcohol use: Yes    Alcohol/week: 0.0 standard drinks  . Drug use: No  . Sexual activity: Yes    Partners: Female  Other Topics Concern  . Not on file  Social History Narrative  . Not on file   Social Determinants of Health   Financial Resource Strain: Not on file  Food Insecurity: Not on file  Transportation Needs: Not on file  Physical Activity: Not on file  Stress: Not on file  Social Connections: Not on file   Additional Social History:    Allergies:  No Known Allergies  Labs:  Results for orders placed or performed during the hospital encounter of 07/30/20 (from the past 48 hour(s))  Comprehensive metabolic panel     Status: Abnormal   Collection Time: 07/30/20  9:35 PM  Result Value Ref Range   Sodium 138 135 - 145 mmol/L   Potassium 3.8 3.5 - 5.1 mmol/L   Chloride 104 98 - 111 mmol/L   CO2 24 22 - 32 mmol/L   Glucose, Bld 129 (H) 70 - 99 mg/dL    Comment: Glucose reference range applies only to samples taken after fasting for at least 8 hours.   BUN 12 6 - 20 mg/dL   Creatinine, Ser 1.61 0.61 - 1.24 mg/dL   Calcium  9.1 8.9 - 09.6 mg/dL   Total Protein 8.0 6.5 - 8.1 g/dL   Albumin 4.8 3.5 - 5.0 g/dL   AST 25 15 - 41 U/L   ALT 23 0 - 44 U/L   Alkaline Phosphatase 56 38 - 126 U/L   Total Bilirubin 1.1 0.3 - 1.2 mg/dL   GFR, Estimated >04 >54 mL/min    Comment: (NOTE) Calculated using the CKD-EPI Creatinine Equation (2021)    Anion gap 10 5 - 15    Comment: Performed at [facility], [address] [place], [place] [address]  Ethanol     Status: None   Collection Time: 07/30/20  9:35  PM  Result Value Ref Range   Alcohol, Ethyl (B) <10 <10 mg/dL    Comment: (NOTE) Lowest detectable limit for serum alcohol is 10 mg/dL.  For medical purposes only. Performed at [facility], [address] [place], [place] [address]   Salicylate level     Status: Abnormal   Collection Time: 07/30/20  9:35 PM  Result Value Ref Range   Salicylate Lvl <7.0 (L) 7.0 - 30.0 mg/dL    Comment: Performed at [facility], [address] [place], [place] [address]  Acetaminophen level     Status: Abnormal   Collection Time: 07/30/20  9:35 PM  Result Value Ref Range   Acetaminophen (Tylenol), Serum <10 (L) 10 - 30 ug/mL    Comment: (NOTE) Therapeutic concentrations vary significantly. A range of 10-30 ug/mL  may be an effective concentration for many patients. However, some  are best treated at concentrations outside of this range. Acetaminophen concentrations >150 ug/mL at 4 hours after ingestion  and >50 ug/mL at 12 hours after ingestion are often associated with  toxic reactions.  Performed at [facility], [address] [place], [place] [address]   cbc     Status: None   Collection Time: 07/30/20  9:35 PM  Result Value Ref Range   WBC 9.3 4.0 - 10.5 K/uL   RBC 5.26 4.22 - 5.81 MIL/uL   Hemoglobin 14.7 13.0 - 17.0 g/dL   HCT 56.3 87.5 - 64.3 %   MCV 81.4 80.0 - 100.0 fL   MCH 27.9 26.0 - 34.0 pg   MCHC 34.3 30.0 - 36.0 g/dL   RDW 32.9 51.8 - 84.1 %   Platelets  242 150 - 400 K/uL   nRBC 0.0 0.0 - 0.2 %    Comment: Performed at [facility], [address] [place], [place] [address]  Urine Drug Screen, Qualitative     Status: None   Collection Time: 07/30/20  9:35 PM  Result Value Ref Range   Tricyclic, Ur Screen NONE DETECTED NONE DETECTED   Amphetamines, Ur Screen NONE DETECTED NONE DETECTED   MDMA (Ecstasy)Ur Screen NONE DETECTED NONE DETECTED   Cocaine Metabolite,Ur [place] NONE DETECTED NONE DETECTED   Opiate, Ur Screen NONE DETECTED NONE DETECTED   Phencyclidine (PCP) Ur S NONE DETECTED NONE DETECTED   Cannabinoid 50 Ng, Ur [place] NONE DETECTED NONE DETECTED   Barbiturates, Ur Screen NONE DETECTED NONE DETECTED   Benzodiazepine, Ur Scrn NONE DETECTED NONE DETECTED   Methadone Scn, Ur NONE DETECTED NONE DETECTED    Comment: (NOTE) Tricyclics + metabolites, urine    Cutoff 1000 ng/mL Amphetamines + metabolites, urine  Cutoff 1000 ng/mL MDMA (Ecstasy), urine              Cutoff 500 ng/mL Cocaine Metabolite, urine          Cutoff 300 ng/mL Opiate + metabolites, urine        Cutoff 300 ng/mL Phencyclidine (PCP), urine         Cutoff 25 ng/mL Cannabinoid, urine                 Cutoff 50 ng/mL Barbiturates + metabolites, urine  Cutoff 200 ng/mL Benzodiazepine, urine              Cutoff 200 ng/mL Methadone, urine                   Cutoff 300 ng/mL  The urine drug screen provides only a preliminary,  unconfirmed analytical test result and should not be used for non-medical purposes. Clinical consideration and professional judgment should be applied to any positive drug screen result due to possible interfering substances. A more specific alternate chemical method must be used in order to obtain a confirmed analytical result. Gas chromatography / mass spectrometry (GC/MS) is the preferred confirm atory method. Performed at [facility], [address] [place], [place] [address]   Resp Panel by RT-PCR (Flu A&B, Covid)  Nasopharyngeal Swab     Status: None   Collection Time: 07/30/20  9:35 PM   Specimen: Nasopharyngeal Swab; Nasopharyngeal(NP) swabs in vial transport medium  Result Value Ref Range   SARS Coronavirus 2 by RT PCR NEGATIVE NEGATIVE    Comment: (NOTE) SARS-CoV-2 target nucleic acids are NOT DETECTED.  The SARS-CoV-2 RNA is generally detectable in upper respiratory specimens during the acute phase of infection. The lowest concentration of SARS-CoV-2 viral copies this assay can detect is 138 copies/mL. A negative result does not preclude SARS-Cov-2 infection and should not be used as the sole basis for treatment or other patient management decisions. A negative result may occur with  improper specimen collection/handling, submission of specimen other than nasopharyngeal swab, presence of viral mutation(s) within the areas targeted by this assay, and inadequate number of viral copies(<138 copies/mL). A negative result must be combined with clinical observations, patient history, and epidemiological information. The expected result is Negative.  Fact Sheet for Patients:  BloggerCourse.com  Fact Sheet for Healthcare Providers:  SeriousBroker.it  This test is no t yet approved or cleared by the [place] FDA and  has been authorized for detection and/or diagnosis of SARS-CoV-2 by FDA under an Emergency Use Authorization (EUA). This EUA will remain  in effect (meaning this test can be used) for the duration of the COVID-19 declaration under Section 564(b)(1) of the Act, 21 U.S.C.section 360bbb-3(b)(1), unless the authorization is terminated  or revoked sooner.       Influenza A by PCR NEGATIVE NEGATIVE   Influenza B by PCR NEGATIVE NEGATIVE    Comment: (NOTE) The Xpert Xpress SARS-CoV-2/FLU/RSV plus assay is intended as an aid in the diagnosis of influenza from Nasopharyngeal swab specimens and should not be used as a sole basis for  treatment. Nasal washings and aspirates are unacceptable for Xpert Xpress SARS-CoV-2/FLU/RSV testing.  Fact Sheet for Patients: BloggerCourse.com  Fact Sheet for Healthcare Providers: SeriousBroker.it  This test is not yet approved or cleared by the [place] FDA and has been authorized for detection and/or diagnosis of SARS-CoV-2 by FDA under an Emergency Use Authorization (EUA). This EUA will remain in effect (meaning this test can be used) for the duration of the COVID-19 declaration under Section 564(b)(1) of the Act, 21 U.S.C. section 360bbb-3(b)(1), unless the authorization is terminated or revoked.  Performed at [facility], [address] [place], [place] [address]     No current facility-administered medications for this encounter.   Current Outpatient Medications  Medication Sig Dispense Refill  . ibuprofen (ADVIL,MOTRIN) 600 MG tablet Take 1 tablet (600 mg total) by mouth every 6 (six) hours as needed. 30 tablet 0    Musculoskeletal: Strength & Muscle Tone: within normal limits Gait & Station: normal Patient leans: N/A  Psychiatric Specialty Exam:  Presentation  General Appearance: Appropriate for Environment  Eye Contact:Good  Speech:Normal Rate  Speech Volume:Decreased  Handedness:Right   Mood and Affect  Mood:Anxious; Depressed  Affect:Depressed   Thought Process  Thought Processes:Coherent  Descriptions of  Associations:Intact  Orientation:Full (Time, Place and Person)  Thought Content:Logical  History of Schizophrenia/Schizoaffective disorder:No data recorded Duration of Psychotic Symptoms:No data recorded Hallucinations:Hallucinations: None  Ideas of Reference:None  Suicidal Thoughts:Suicidal Thoughts: No  Homicidal Thoughts:Homicidal Thoughts: No   Sensorium  Memory:Immediate Good; Recent Good; Remote Good  Judgment:Good  Insight:Good   Executive  Functions  Concentration:Good  Attention Span:Good  Recall:Good  Fund of Knowledge:Good  Language:Good   Psychomotor Activity  Psychomotor Activity:Psychomotor Activity: Normal   Assets  Assets:Communication Skills; Resilience; Intimacy   Sleep  Sleep:Sleep: Good   Physical Exam: Physical Exam Vitals and nursing note reviewed.  Constitutional:      Appearance: Normal appearance. He is normal weight.  HENT:     Nose: Nose normal.     Mouth/Throat:     Mouth: Mucous membranes are moist.  Cardiovascular:     Rate and Rhythm: Normal rate.     Pulses: Normal pulses.  Pulmonary:     Effort: Pulmonary effort is normal.  Musculoskeletal:        General: Normal range of motion.     Cervical back: Normal range of motion.  Neurological:     General: No focal deficit present.     Mental Status: He is alert and oriented to person, place, and time. Mental status is at baseline.  Psychiatric:        Attention and Perception: Attention and perception normal.        Mood and Affect: Affect normal. Mood is anxious and depressed.        Speech: Speech normal.        Behavior: Behavior normal. Behavior is cooperative.        Thought Content: Thought content normal.        Cognition and Memory: Cognition and memory normal.        Judgment: Judgment normal.    Review of Systems  Psychiatric/Behavioral: Positive for depression. The patient is nervous/anxious.   All other systems reviewed and are negative.  Blood pressure 134/82, pulse 78, temperature 98.3 F (36.8 C), temperature source Oral, resp. rate 16, height 5\' 9"  (1.753 m), weight 95.3 kg, SpO2 98 %. Body mass index is 31.01 kg/m.  Treatment Plan Summary: Daily contact with patient to assess and evaluate symptoms and progress in treatment and Plan The patient remained under observation overnight and will be reassessed in the a.m. to determine if he meets the criteria for psychiatric inpatient admission; he could be  discharged home.  Disposition: Supportive therapy provided about ongoing stressors. The patient remained under observation overnight and will be reassessed in the a.m. to determine if he meets the criteria for psychiatric inpatient admission; he could be discharged home.  , NP 07/31/2020 1:44 AM

## 2020-07-31 NOTE — ED Notes (Signed)
Pt dressing for discharge.  

## 2020-07-31 NOTE — ED Provider Notes (Signed)
-----------------------------------------   11:51 AM on 07/31/2020 -----------------------------------------  Patient has been seen and evaluated by psychiatry.  They believe the patient is safe for discharge home from a psychiatric standpoint.  The patient's medical work-up is been largely nonrevealing.  Patient will be discharged home.  Has outpatient resources.   Minna Antis, MD 07/31/20 1151

## 2020-07-31 NOTE — ED Notes (Signed)
Pt discharged home. VS stable. All belongings returned to patient. Discharge instructions reviewed with patient.  

## 2020-07-31 NOTE — Discharge Instructions (Addendum)
You have been seen in the emergency department for a  psychiatric concern. You have been evaluated both medically as well as psychiatrically. Please follow-up with your outpatient resources provided. Return to the emergency department for any worsening symptoms, or any thoughts of hurting yourself or anyone else so that we may attempt to help you. 

## 2020-07-31 NOTE — Consult Note (Signed)
[facility] Face-to-Face Psychiatry Consult   Reason for Consult: Consult for 35 year old man with history of some depressive symptoms brought in under IVC Referring Physician: Paduchowski Patient Identification: Jason Mason MRN:  [id number] Principal Diagnosis: MDD (major depressive disorder), recurrent episode, mild (HCC) Diagnosis:  Principal Problem:   MDD (major depressive disorder), recurrent episode, mild (HCC)   Total Time spent with patient: 1 hour  Subjective:   Jason Mason is a 35 y.o. male patient admitted with "I got in a disagreement with my wife".  HPI: Patient seen chart reviewed.  Patient was brought in under IVC filed by his "fianc".  She states that he has had mood instability, that he drinks a lot and alleges that he made a possibly suicidal statement.  Patient says he got in a disagreement with her last night.  He says this is part of an ongoing series of disagreements they have had.  He does admit to feeling frustrated down and anxious at times although not constantly.  He denies having any suicidal thoughts.  He admits that in the heat of argument he may have said something about going out in a body bag but insists that he never meant that he was seriously wanting to or thinking of harming himself.  Denies any homicidal ideation.  Sleep overall is okay.  Appetite okay.  Denies any auditory or visual hallucinations.  Paperwork alleges that he drinks heavily but the patient says he drinks only modestly and mostly on the weekend.  Alcohol level on presentation was negative.  Patient says he has an appointment to see a therapist coming up in about a week and a half.  Past Psychiatric History: No past hospitalization.  No past medication.  Brief episodes of intermittent mostly casual counseling.  Denies any history of suicide attempts or violence  Risk to Self:   Risk to Others:   Prior Inpatient Therapy:   Prior Outpatient Therapy:    Past Medical History: History  reviewed. No pertinent past medical history.  Past Surgical History:  Procedure Laterality Date  . JOINT REPLACEMENT     Family History:  Family History  Problem Relation Age of Onset  . Diabetes Mother   . Diabetes Father   . Hypertension Father   . Cancer Maternal Grandmother        bone marrow cancer  . Cancer Maternal Grandfather        bone marrow cancer   Family Psychiatric  History: Denies any Social History:  Social History   Substance and Sexual Activity  Alcohol Use Yes  . Alcohol/week: 0.0 standard drinks     Social History   Substance and Sexual Activity  Drug Use No    Social History   Socioeconomic History  . Marital status: Married    Spouse name: Not on file  . Number of children: Not on file  . Years of education: Not on file  . Highest education level: Not on file  Occupational History  . Not on file  Tobacco Use  . Smoking status: Never Smoker  . Smokeless tobacco: Never Used  Substance and Sexual Activity  . Alcohol use: Yes    Alcohol/week: 0.0 standard drinks  . Drug use: No  . Sexual activity: Yes    Partners: Female  Other Topics Concern  . Not on file  Social History Narrative  . Not on file   Social Determinants of Health   Financial Resource Strain: Not on file  Food Insecurity: Not on  file  Transportation Needs: Not on file  Physical Activity: Not on file  Stress: Not on file  Social Connections: Not on file   Additional Social History:    Allergies:  No Known Allergies  Labs:  Results for orders placed or performed during the hospital encounter of 07/30/20 (from the past 48 hour(s))  Comprehensive metabolic panel     Status: Abnormal   Collection Time: 07/30/20  9:35 PM  Result Value Ref Range   Sodium 138 135 - 145 mmol/L   Potassium 3.8 3.5 - 5.1 mmol/L   Chloride 104 98 - 111 mmol/L   CO2 24 22 - 32 mmol/L   Glucose, Bld 129 (H) 70 - 99 mg/dL    Comment: Glucose reference range applies only to samples taken  after fasting for at least 8 hours.   BUN 12 6 - 20 mg/dL   Creatinine, Ser 4.090.93 0.61 - 1.24 mg/dL   Calcium 9.1 8.9 - 81.110.3 mg/dL   Total Protein 8.0 6.5 - 8.1 g/dL   Albumin 4.8 3.5 - 5.0 g/dL   AST 25 15 - 41 U/L   ALT 23 0 - 44 U/L   Alkaline Phosphatase 56 38 - 126 U/L   Total Bilirubin 1.1 0.3 - 1.2 mg/dL   GFR, Estimated >91>60 >47>60 mL/min    Comment: (NOTE) Calculated using the CKD-EPI Creatinine Equation (2021)    Anion gap 10 5 - 15    Comment: Performed at [facility], [address] [place], [place] [address]  Ethanol     Status: None   Collection Time: 07/30/20  9:35 PM  Result Value Ref Range   Alcohol, Ethyl (B) <10 <10 mg/dL    Comment: (NOTE) Lowest detectable limit for serum alcohol is 10 mg/dL.  For medical purposes only. Performed at [facility], [address] [place], [place] [address]   Salicylate level     Status: Abnormal   Collection Time: 07/30/20  9:35 PM  Result Value Ref Range   Salicylate Lvl <7.0 (L) 7.0 - 30.0 mg/dL    Comment: Performed at [facility], [address] [place], [place] [address]  Acetaminophen level     Status: Abnormal   Collection Time: 07/30/20  9:35 PM  Result Value Ref Range   Acetaminophen (Tylenol), Serum <10 (L) 10 - 30 ug/mL    Comment: (NOTE) Therapeutic concentrations vary significantly. A range of 10-30 ug/mL  may be an effective concentration for many patients. However, some  are best treated at concentrations outside of this range. Acetaminophen concentrations >150 ug/mL at 4 hours after ingestion  and >50 ug/mL at 12 hours after ingestion are often associated with  toxic reactions.  Performed at [facility], [address] [place], [place] [address]   cbc     Status: None   Collection Time: 07/30/20  9:35 PM  Result Value Ref Range   WBC 9.3 4.0 - 10.5 K/uL   RBC 5.26 4.22 - 5.81 MIL/uL   Hemoglobin 14.7 13.0 - 17.0 g/dL   HCT 52.842.8 41.339.0 - 24.452.0 %   MCV 81.4  80.0 - 100.0 fL   MCH 27.9 26.0 - 34.0 pg   MCHC 34.3 30.0 - 36.0 g/dL   RDW 01.011.9 27.211.5 - 53.615.5 %   Platelets 242 150 - 400 K/uL   nRBC 0.0 0.0 - 0.2 %    Comment: Performed at [facility], [address] [place], [place] [address]  Urine Drug Screen,  Qualitative     Status: None   Collection Time: 07/30/20  9:35 PM  Result Value Ref Range   Tricyclic, Ur Screen NONE DETECTED NONE DETECTED   Amphetamines, Ur Screen NONE DETECTED NONE DETECTED   MDMA (Ecstasy)Ur Screen NONE DETECTED NONE DETECTED   Cocaine Metabolite,Ur [place] NONE DETECTED NONE DETECTED   Opiate, Ur Screen NONE DETECTED NONE DETECTED   Phencyclidine (PCP) Ur S NONE DETECTED NONE DETECTED   Cannabinoid 50 Ng, Ur [place] NONE DETECTED NONE DETECTED   Barbiturates, Ur Screen NONE DETECTED NONE DETECTED   Benzodiazepine, Ur Scrn NONE DETECTED NONE DETECTED   Methadone Scn, Ur NONE DETECTED NONE DETECTED    Comment: (NOTE) Tricyclics + metabolites, urine    Cutoff 1000 ng/mL Amphetamines + metabolites, urine  Cutoff 1000 ng/mL MDMA (Ecstasy), urine              Cutoff 500 ng/mL Cocaine Metabolite, urine          Cutoff 300 ng/mL Opiate + metabolites, urine        Cutoff 300 ng/mL Phencyclidine (PCP), urine         Cutoff 25 ng/mL Cannabinoid, urine                 Cutoff 50 ng/mL Barbiturates + metabolites, urine  Cutoff 200 ng/mL Benzodiazepine, urine              Cutoff 200 ng/mL Methadone, urine                   Cutoff 300 ng/mL  The urine drug screen provides only a preliminary, unconfirmed analytical test result and should not be used for non-medical purposes. Clinical consideration and professional judgment should be applied to any positive drug screen result due to possible interfering substances. A more specific alternate chemical method must be used in order to obtain a confirmed analytical result. Gas chromatography / mass spectrometry (GC/MS) is the preferred confirm atory method. Performed at [facility], [address] [place], [place] [address]   Resp Panel by RT-PCR (Flu A&B, Covid) Nasopharyngeal Swab     Status: None   Collection Time: 07/30/20  9:35 PM   Specimen: Nasopharyngeal Swab; Nasopharyngeal(NP) swabs in vial transport medium  Result Value Ref Range   SARS Coronavirus 2 by RT PCR NEGATIVE NEGATIVE    Comment: (NOTE) SARS-CoV-2 target nucleic acids are NOT DETECTED.  The SARS-CoV-2 RNA is generally detectable in upper respiratory specimens during the acute phase of infection. The lowest concentration of SARS-CoV-2 viral copies this assay can detect is 138 copies/mL. A negative result does not preclude SARS-Cov-2 infection and should not be used as the sole basis for treatment or other patient management decisions. A negative result may occur with  improper specimen collection/handling, submission of specimen other than nasopharyngeal swab, presence of viral mutation(s) within the areas targeted by this assay, and inadequate number of viral copies(<138 copies/mL). A negative result must be combined with clinical observations, patient history, and epidemiological information. The expected result is Negative.  Fact Sheet for Patients:  BloggerCourse.com  Fact Sheet for Healthcare Providers:  SeriousBroker.it  This test is no t yet approved or cleared by the [place] FDA and  has been authorized for detection and/or diagnosis of SARS-CoV-2 by FDA under an Emergency Use Authorization (EUA). This EUA will remain  in effect (meaning this test can be used) for the duration of the COVID-19 declaration under Section 564(b)(1) of the Act, 21 U.S.C.section 360bbb-3(b)(1),  unless the authorization is terminated  or revoked sooner.       Influenza A by PCR NEGATIVE NEGATIVE   Influenza B by PCR NEGATIVE NEGATIVE    Comment: (NOTE) The Xpert Xpress SARS-CoV-2/FLU/RSV plus assay is intended as an aid in the  diagnosis of influenza from Nasopharyngeal swab specimens and should not be used as a sole basis for treatment. Nasal washings and aspirates are unacceptable for Xpert Xpress SARS-CoV-2/FLU/RSV testing.  Fact Sheet for Patients: BloggerCourse.com  Fact Sheet for Healthcare Providers: SeriousBroker.it  This test is not yet approved or cleared by the [place] FDA and has been authorized for detection and/or diagnosis of SARS-CoV-2 by FDA under an Emergency Use Authorization (EUA). This EUA will remain in effect (meaning this test can be used) for the duration of the COVID-19 declaration under Section 564(b)(1) of the Act, 21 U.S.C. section 360bbb-3(b)(1), unless the authorization is terminated or revoked.  Performed at [facility], [address] [place], [place] [address]     No current facility-administered medications for this encounter.   Current Outpatient Medications  Medication Sig Dispense Refill  . ibuprofen (ADVIL,MOTRIN) 600 MG tablet Take 1 tablet (600 mg total) by mouth every 6 (six) hours as needed. 30 tablet 0    Musculoskeletal: Strength & Muscle Tone: within normal limits Gait & Station: normal Patient leans: N/A            Psychiatric Specialty Exam:  Presentation  General Appearance: Appropriate for Environment  Eye Contact:Good  Speech:Normal Rate  Speech Volume:Decreased  Handedness:Right   Mood and Affect  Mood:Anxious; Depressed  Affect:Depressed   Thought Process  Thought Processes:Coherent  Descriptions of Associations:Intact  Orientation:Full (Time, Place and Person)  Thought Content:Logical  History of Schizophrenia/Schizoaffective disorder:No  Duration of Psychotic Symptoms:No data recorded Hallucinations:Hallucinations: None  Ideas of Reference:None  Suicidal Thoughts:Suicidal Thoughts: No  Homicidal Thoughts:Homicidal Thoughts:  No   Sensorium  Memory:Immediate Good; Recent Good; Remote Good  Judgment:Good  Insight:Good   Executive Functions  Concentration:Good  Attention Span:Good  Recall:Good  Fund of Knowledge:Good  Language:Good   Psychomotor Activity  Psychomotor Activity:Psychomotor Activity: Normal   Assets  Assets:Communication Skills; Resilience; Intimacy   Sleep  Sleep:Sleep: Good   Physical Exam: Physical Exam Vitals and nursing note reviewed.  Constitutional:      Appearance: Normal appearance.  HENT:     Head: Normocephalic and atraumatic.     Mouth/Throat:     Pharynx: Oropharynx is clear.  Eyes:     Pupils: Pupils are equal, round, and reactive to light.  Cardiovascular:     Rate and Rhythm: Normal rate and regular rhythm.  Pulmonary:     Effort: Pulmonary effort is normal.     Breath sounds: Normal breath sounds.  Abdominal:     General: Abdomen is flat.     Palpations: Abdomen is soft.  Musculoskeletal:        General: Normal range of motion.  Skin:    General: Skin is warm and dry.  Neurological:     General: No focal deficit present.     Mental Status: He is alert. Mental status is at baseline.  Psychiatric:        Attention and Perception: Attention normal.        Mood and Affect: Mood normal.        Speech: Speech normal.        Behavior: Behavior is cooperative.        Thought Content: Thought content normal.  Cognition and Memory: Cognition normal.        Judgment: Judgment normal.    Review of Systems  Constitutional: Negative.   HENT: Negative.   Eyes: Negative.   Respiratory: Negative.   Cardiovascular: Negative.   Gastrointestinal: Negative.   Musculoskeletal: Negative.   Skin: Negative.   Neurological: Negative.   Psychiatric/Behavioral: Negative for depression, hallucinations, memory loss, substance abuse and suicidal ideas. The patient is not nervous/anxious and does not have insomnia.    Blood pressure 138/90, pulse 75,  temperature 97.8 F (36.6 C), temperature source Oral, resp. rate 18, height 5\' 9"  (1.753 m), weight 95.3 kg, SpO2 98 %. Body mass index is 31.01 kg/m.  Treatment Plan Summary: Plan Patient presents as calm and appropriate in his interactions.  He has consistently denied suicidal ideation since coming to the emergency room.  No sign of harm to himself or anyone else.  Labs unremarkable no sign of alcohol or drug abuse.  Patient received counseling about the importance of engaging in some therapy in treating his mood symptoms.  Psychoeducation and supportive counseling done.  Encourage patient to remove firearms from his home if he is ever having any impulsive or suicidal thoughts.  At this point no longer meets commitment criteria.  Discontinue IVC he can be released from the emergency room to outpatient treatment.  Disposition: No evidence of imminent risk to self or others at present.   Patient does not meet criteria for psychiatric inpatient admission. Supportive therapy provided about ongoing stressors.  , MD 07/31/2020 12:39 PM

## 2020-08-12 DIAGNOSIS — R454 Irritability and anger: Secondary | ICD-10-CM | POA: Diagnosis not present

## 2020-08-12 DIAGNOSIS — F4320 Adjustment disorder, unspecified: Secondary | ICD-10-CM | POA: Diagnosis not present

## 2020-12-12 DIAGNOSIS — Z113 Encounter for screening for infections with a predominantly sexual mode of transmission: Secondary | ICD-10-CM | POA: Diagnosis not present

## 2022-04-21 ENCOUNTER — Other Ambulatory Visit: Payer: Self-pay

## 2022-04-21 ENCOUNTER — Emergency Department
Admission: EM | Admit: 2022-04-21 | Discharge: 2022-04-21 | Disposition: A | Discharge status: Home / Self Care | Payer: Worker's Compensation | Attending: Emergency Medicine | Admitting: Emergency Medicine

## 2022-04-21 DIAGNOSIS — Y990 Civilian activity done for income or pay: Secondary | ICD-10-CM | POA: Insufficient documentation

## 2022-04-21 DIAGNOSIS — Y9285 Railroad track as the place of occurrence of the external cause: Secondary | ICD-10-CM | POA: Insufficient documentation

## 2022-04-21 DIAGNOSIS — W01198A Fall on same level from slipping, tripping and stumbling with subsequent striking against other object, initial encounter: Secondary | ICD-10-CM | POA: Diagnosis not present

## 2022-04-21 DIAGNOSIS — M25561 Pain in right knee: Secondary | ICD-10-CM | POA: Diagnosis not present

## 2022-04-21 NOTE — ED Triage Notes (Signed)
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 Pt states he fine, had no pain, only barely hit his knee. States needs doctor note so he can continue working.

## 2022-04-21 NOTE — Discharge Instructions (Signed)
You did not wish to have any imaging done today.  Please return if your pain returns, worsens, or if you develop any other new, worsening, or changing symptoms or other concerns.  It was a pleasure caring for you today.

## 2022-04-21 NOTE — ED Provider Notes (Signed)
[facility] Provider Note    Event Date/Time   First MD Initiated Contact with Patient 04/21/22 1254     (approximate)   History   Knee Pain   HPI  Jason Mason is a 37 y.o. male who presents today for request of his job pain.  Patient reports that [place] [employer] and he lost his balance and struck his knee on a piece of equipment.  He reports that he did not fall to the ground.  He reports that he had a little bit of localized swelling yesterday which has resolved completely today.  He has no pain today.  He has not had any numbness, tingling, weakness, or trouble ambulating.  He reports that his pain has resolved completely.  He reports that he just needs a note to return to work, does not want any further workup done today.     Physical Exam   Triage Vital Signs: ED Triage Vitals [04/21/22 1249]  Enc Vitals Group     BP 131/73     Pulse Rate 66     Resp 16     Temp 98.4 F (36.9 C)     Temp Source Oral     SpO2 98 %     Weight 230 lb (104.3 kg)     Height 5' 8"$  (1.727 m)     Head Circumference      Peak Flow      Pain Score 0     Pain Loc      Pain Edu?      Excl. in [place]?     Most recent vital signs: Vitals:   04/21/22 1249 04/21/22 1312  BP: 131/73 (!) 133/100  Pulse: 66 75  Resp: 16 16  Temp: 98.4 F (36.9 C) 98.1 F (36.7 C)  SpO2: 98% 100%    Physical Exam Vitals and nursing note reviewed.  Constitutional:      General: Awake and alert. No acute distress.    Appearance: Normal appearance. The patient is normal weight.  HENT:     Head: Normocephalic and atraumatic.     Mouth: Mucous membranes are moist.  Eyes:     General: PERRL. Normal EOMs        Right eye: No discharge.        Left eye: No discharge.     Conjunctiva/sclera: Conjunctivae normal.  Cardiovascular:     Rate and Rhythm: Normal rate and regular rhythm.     Pulses: Normal pulses.  Pulmonary:     Effort: Pulmonary effort is normal. No  respiratory distress.     Breath sounds: Normal breath sounds.  Abdominal:     Abdomen is soft. There is no abdominal tenderness. No rebound or guarding. No distention. Musculoskeletal:        General: No swelling. Normal range of motion.     Cervical back: Normal range of motion and neck supple.  Right knee: No deformity or rash.  Midline surgical incision, well-healed.  No joint line tenderness. No patellar tenderness, no ballotment Warm and well perfused extremity with 2+ pedal pulses 5/5 strength to dorsiflexion and plantarflexion at the ankle with intact sensation throughout extremity Normal range of motion of the knee, with intact flexion and extension to active and passive range of motion. Extensor mechanism intact. No ligamentous laxity. Negative anterior/posterior drawer/negative lachman, negative mcmurrays No effusion or warmth Intact quadriceps, hamstring function, patellar tendon function Pelvis stable Full ROM of ankle without  pain or swelling Foot warm and well perfused Skin:    General: Skin is warm and dry.     Capillary Refill: Capillary refill takes less than 2 seconds.     Findings: No rash.  Neurological:     Mental Status: The patient is awake and alert.      ED Results / Procedures / Treatments   Labs (all labs ordered are listed, but only abnormal results are displayed) Labs Reviewed - No data to display   EKG     RADIOLOGY     PROCEDURES:  Critical Care performed:   Procedures   MEDICATIONS ORDERED IN ED: Medications - No data to display   IMPRESSION / MDM / [place] / ED COURSE  I reviewed the triage vital signs and the nursing notes.   Differential diagnosis includes, but is not limited to, effusion, sprain, contusion, dislocation, fracture, joint infection, tendon rupture. No evidence of neurological deficit or vascular compromise on exam.  Patient has full normal range of motion with normal exam, ambulatory steady gait.  He declined X-Ray. No deformity or obvious ligamentous laxity on exam. No constitutional symptoms or effusion to suggest septic joint. No history of immunosuppression. Overall well appearing, vital signs stable.  Return precautions and care instructions discussed. Outpatient follow-up advised.  He was given a work note as requested patient agrees with plan of care.    Patient's presentation is most consistent with acute complicated illness / injury requiring diagnostic workup.    FINAL CLINICAL IMPRESSION(S) / ED DIAGNOSES   Final diagnoses:  Acute pain of right knee     Rx / DC Orders   ED Discharge Orders     None        Note:  This document was prepared using Dragon voice recognition software and may include unintentional dictation errors.   Emeline Gins 04/21/22 1347    Arta Silence, MD 04/21/22 1540

## 2022-04-26 ENCOUNTER — Telehealth: Payer: Self-pay

## 2022-04-26 NOTE — Telephone Encounter (Signed)
Patient called and stated that he has been a patient of Dr. Golden Pop for years, but was unaware that Dr. Jeananne Rama had retired. Patient recently went to ED for knee pain. Patient's employer request documentation that patient has been seen previously for knee pain. Patient would like a note for his employer stating that he used to be a patient at [facility]. Please advise patient, [phone] if documentation can be done and for additional information.

## 2022-04-26 NOTE — Telephone Encounter (Signed)
Patient is not a current patient and has not seen providers here. Letter cannot be provided at this time. Please notify patient.

## 2022-04-26 NOTE — Telephone Encounter (Signed)
Spoke with caller to obtain additional details about request. Caller stated that he would call back at a later time if he still needs assistance.

## 2022-04-29 ENCOUNTER — Telehealth: Payer: Self-pay

## 2022-04-29 NOTE — Telephone Encounter (Signed)
Copied from [place] [id number]. Topic: General - Other >> Apr 29, 2022 11:05 AM Tiffany B wrote: Reason for CRM: Patient states he has been a patient of Dr. Jeananne Rama for several years and he would like the following:  1- Requesting a letter that he came to the office on 04/21/2022 but there was a note stating the office relocated temporarily to [place].  2- Requesting medical records reflecting he saw Dr. Jeananne Rama for his knees.(Chart does not reflect)   Patient states his employer is requesting proof that the office relocated and if he does not get some type of letter he may loose his job. Caller requesting to speak with practice admin.   Patinent does have a NPA scheduled for 01/04/2023 and is aware is not a current patient.

## 2022-05-03 NOTE — Telephone Encounter (Signed)
Spoke with Mr. Jason Mason regarding request. Typed letter and sent via secure email.

## 2022-05-03 NOTE — Telephone Encounter (Signed)
Patient called back to get an update on his request. Please follow up with patient.

## 2022-05-04 NOTE — Telephone Encounter (Signed)
Patient is calling back because he needs paperwork that states he was there 02/14. Patient says the paperwork he received doesn't say he was there specifically on the 14th. Please follow up with patient.

## 2022-05-11 ENCOUNTER — Telehealth: Payer: Self-pay | Admitting: Family

## 2022-05-11 NOTE — Telephone Encounter (Signed)
Copied from [place] [phone]. Topic: General - Other >> May 11, 2022 11:53 AM [place] B wrote: Reason for CRM: patient called in states will have to pick up records info from injury he had years back on tomorrow

## 2022-05-11 NOTE — Telephone Encounter (Signed)
Noted  

## 2022-05-13 ENCOUNTER — Telehealth: Payer: Self-pay

## 2022-05-17 NOTE — Telephone Encounter (Signed)
error 

## 2022-06-30 ENCOUNTER — Other Ambulatory Visit: Payer: Self-pay

## 2022-06-30 ENCOUNTER — Encounter (HOSPITAL_COMMUNITY): Payer: Self-pay | Admitting: *Deleted

## 2022-06-30 ENCOUNTER — Emergency Department (HOSPITAL_COMMUNITY)
Admission: EM | Admit: 2022-06-30 | Discharge: 2022-06-30 | Disposition: A | Discharge status: Home / Self Care | Payer: 59 | Attending: Emergency Medicine | Admitting: Emergency Medicine

## 2022-06-30 DIAGNOSIS — R112 Nausea with vomiting, unspecified: Secondary | ICD-10-CM | POA: Insufficient documentation

## 2022-06-30 DIAGNOSIS — Z0279 Encounter for issue of other medical certificate: Secondary | ICD-10-CM | POA: Insufficient documentation

## 2022-06-30 MED ORDER — ONDANSETRON 4 MG PO TBDP: 4.0000 mg | ORAL_TABLET | Freq: Three times a day (TID) | ORAL | 0 refills | Status: DC | PRN

## 2022-06-30 NOTE — ED Provider Notes (Signed)
[place] EMERGENCY DEPARTMENT AT [facility] Provider Note   CSN: [id number] Arrival date & time: 06/30/22  1639     History  No chief complaint on file.   ARMEL RABBANI is a 37 y.o. male.  37 year old male presents today for evaluation of needing a work note.  He states he had 2 episodes of vomiting today.  He attributes this to the food he had last night.  He states his symptoms have resolved since.  He has no complaints but states he needs a work note to return to work.  Denies any abdominal pain, dysuria, blood in stool, hematemesis.  The history is provided by the patient. No language interpreter was used.       Home Medications Prior to Admission medications   Medication Sig Start Date End Date Taking? Authorizing Provider  ibuprofen (ADVIL,MOTRIN) 600 MG tablet Take 1 tablet (600 mg total) by mouth every 6 (six) hours as needed. 12/29/17   Enid Derry, PA-C      Allergies    Patient has no known allergies.    Review of Systems   Review of Systems  Constitutional:  Negative for chills and fever.  Gastrointestinal:  Positive for nausea (now resolved) and vomiting (Now resolved). Negative for abdominal pain.  Neurological:  Negative for light-headedness.  All other systems reviewed and are negative.   Physical Exam Updated Vital Signs BP 116/70 (BP Location: Right Arm)   Pulse 67   Temp 98.2 F (36.8 C) (Oral)   Resp 18   SpO2 99%  Physical Exam Vitals and nursing note reviewed.  Constitutional:      General: He is not in acute distress.    Appearance: Normal appearance. He is not ill-appearing.  HENT:     Head: Normocephalic and atraumatic.     Nose: Nose normal.  Eyes:     General: No scleral icterus.    Extraocular Movements: Extraocular movements intact.     Conjunctiva/sclera: Conjunctivae normal.  Cardiovascular:     Rate and Rhythm: Normal rate and regular rhythm.     Pulses: Normal pulses.  Pulmonary:     Effort: Pulmonary  effort is normal. No respiratory distress.     Breath sounds: Normal breath sounds. No wheezing or rales.  Abdominal:     General: There is no distension.     Tenderness: There is no abdominal tenderness.  Musculoskeletal:        General: Normal range of motion.     Cervical back: Normal range of motion.  Skin:    General: Skin is warm and dry.  Neurological:     General: No focal deficit present.     Mental Status: He is alert. Mental status is at baseline.     ED Results / Procedures / Treatments   Labs (all labs ordered are listed, but only abnormal results are displayed) Labs Reviewed - No data to display  EKG None  Radiology No results found.  Procedures Procedures    Medications Ordered in ED Medications - No data to display  ED Course/ Medical Decision Making/ A&P                             Medical Decision Making  37 year old male presents today for work note.  He states he had couple episodes of emesis which she attributes to the food he had last night.  Symptoms have resolved.  He states he wants  to return to work tomorrow but needs a work note.  Denies other complaints.  Otherwise he is well-appearing.  Final Clinical Impression(s) / ED Diagnoses Final diagnoses:  Nausea and vomiting, unspecified vomiting type    Rx / DC Orders ED Discharge Orders          Ordered    ondansetron (ZOFRAN-ODT) 4 MG disintegrating tablet  Every 8 hours PRN        06/30/22 1700              Marita Kansas, PA-C 06/30/22 1702    Gwyneth Sprout, MD 06/30/22 1811

## 2022-06-30 NOTE — ED Triage Notes (Signed)
The pt was vomiting last pm and he did not go to work yesterday  he is here for a work note for his job

## 2022-06-30 NOTE — Discharge Instructions (Signed)
Exam today is overall reassuring.  Sent some nausea medication to the pharmacy for you to keep on hand.  He states your symptoms are resolved.  You are okay to return to work.

## 2022-07-12 ENCOUNTER — Other Ambulatory Visit: Payer: Self-pay

## 2022-07-12 ENCOUNTER — Emergency Department (HOSPITAL_COMMUNITY): Payer: 59

## 2022-07-12 ENCOUNTER — Emergency Department (HOSPITAL_COMMUNITY)
Admission: EM | Admit: 2022-07-12 | Discharge: 2022-07-12 | Disposition: A | Discharge status: Home / Self Care | Payer: 59 | Attending: Emergency Medicine | Admitting: Emergency Medicine

## 2022-07-12 DIAGNOSIS — R531 Weakness: Secondary | ICD-10-CM | POA: Diagnosis present

## 2022-07-12 DIAGNOSIS — G43109 Migraine with aura, not intractable, without status migrainosus: Secondary | ICD-10-CM

## 2022-07-12 LAB — COMPREHENSIVE METABOLIC PANEL
ALT: 21 U/L (ref 0–44)
AST: 21 U/L (ref 15–41)
Albumin: 4.3 g/dL (ref 3.5–5.0)
Alkaline Phosphatase: 50 U/L (ref 38–126)
Anion gap: 10 (ref 5–15)
BUN: 9 mg/dL (ref 6–20)
CO2: 25 mmol/L (ref 22–32)
Calcium: 9.4 mg/dL (ref 8.9–10.3)
Chloride: 103 mmol/L (ref 98–111)
Creatinine, Ser: 0.9 mg/dL (ref 0.61–1.24)
GFR, Estimated: >60 mL/min (ref >60)
Glucose, Bld: 125 mg/dL — ABNORMAL HIGH (ref 70–99)
Potassium: 3.4 mmol/L — ABNORMAL LOW (ref 3.5–5.1)
Sodium: 138 mmol/L (ref 135–145)
Total Bilirubin: 1.2 mg/dL (ref 0.3–1.2)
Total Protein: 7.4 g/dL (ref 6.5–8.1)

## 2022-07-12 LAB — DIFFERENTIAL
Abs Immature Granulocytes: 0.03 10*3/uL (ref 0.00–0.07)
Basophils Absolute: 0 10*3/uL (ref 0.0–0.1)
Basophils Relative: 0 %
Eosinophils Absolute: 0.1 10*3/uL (ref 0.0–0.5)
Eosinophils Relative: 1 %
Immature Granulocytes: 0 %
Lymphocytes Relative: 20 %
Lymphs Abs: 1.9 10*3/uL (ref 0.7–4.0)
Monocytes Absolute: 0.4 10*3/uL (ref 0.1–1.0)
Monocytes Relative: 4 %
Neutro Abs: 6.9 10*3/uL (ref 1.7–7.7)
Neutrophils Relative %: 75 %

## 2022-07-12 LAB — I-STAT CHEM 8, ED
BUN: 9 mg/dL (ref 6–20)
Calcium, Ion: 1.22 mmol/L (ref 1.15–1.40)
Chloride: 104 mmol/L (ref 98–111)
Creatinine, Ser: 0.9 mg/dL (ref 0.61–1.24)
Glucose, Bld: 127 mg/dL — ABNORMAL HIGH (ref 70–99)
HCT: 42 % (ref 39.0–52.0)
Hemoglobin: 14.3 g/dL (ref 13.0–17.0)
Potassium: 3.6 mmol/L (ref 3.5–5.1)
Sodium: 140 mmol/L (ref 135–145)
TCO2: 26 mmol/L (ref 22–32)

## 2022-07-12 LAB — CBC
HCT: 41.2 % (ref 39.0–52.0)
Hemoglobin: 14.1 g/dL (ref 13.0–17.0)
MCH: 27.5 pg (ref 26.0–34.0)
MCHC: 34.2 g/dL (ref 30.0–36.0)
MCV: 80.5 fL (ref 80.0–100.0)
Platelets: 223 10*3/uL (ref 150–400)
RBC: 5.12 MIL/uL (ref 4.22–5.81)
RDW: 11.9 % (ref 11.5–15.5)
WBC: 9.3 10*3/uL (ref 4.0–10.5)
nRBC: 0 % (ref 0.0–0.2)

## 2022-07-12 LAB — ETHANOL: Alcohol, Ethyl (B): <10 mg/dL (ref <10)

## 2022-07-12 LAB — RAPID URINE DRUG SCREEN, HOSP PERFORMED
Amphetamines: NOT DETECTED
Barbiturates: NOT DETECTED
Benzodiazepines: NOT DETECTED
Cocaine: NOT DETECTED
Opiates: NOT DETECTED
Tetrahydrocannabinol: NOT DETECTED

## 2022-07-12 LAB — CBG MONITORING, ED: Glucose-Capillary: 120 mg/dL — ABNORMAL HIGH (ref 70–99)

## 2022-07-12 LAB — APTT: aPTT: 31 seconds (ref 24–36)

## 2022-07-12 LAB — PROTIME-INR
INR: 1 (ref 0.8–1.2)
Prothrombin Time: 13.4 seconds (ref 11.4–15.2)

## 2022-07-12 MED ORDER — SODIUM CHLORIDE 0.9 % IV SOLN: INTRAVENOUS | Status: DC

## 2022-07-12 MED ORDER — SODIUM CHLORIDE 0.9 % IV BOLUS
1000.0000 mL | Freq: Once | INTRAVENOUS | Status: AC
  Administered 2022-07-12: 1000 mL via INTRAVENOUS

## 2022-07-12 MED ORDER — METOCLOPRAMIDE HCL 5 MG/ML IJ SOLN
10.0000 mg | Freq: Once | INTRAMUSCULAR | Status: AC
  Administered 2022-07-12: 10 mg via INTRAVENOUS
  Filled 2022-07-12: qty 2

## 2022-07-12 MED ORDER — IOHEXOL 350 MG/ML SOLN
100.0000 mL | Freq: Once | INTRAVENOUS | Status: AC | PRN
  Administered 2022-07-12: 100 mL via INTRAVENOUS

## 2022-07-12 MED ORDER — DIPHENHYDRAMINE HCL 50 MG/ML IJ SOLN
12.5000 mg | Freq: Once | INTRAMUSCULAR | Status: AC
  Administered 2022-07-12: 12.5 mg via INTRAVENOUS
  Filled 2022-07-12: qty 1

## 2022-07-12 NOTE — ED Provider Notes (Signed)
[place] EMERGENCY DEPARTMENT AT [facility] Provider Note   CSN: [id number] Arrival date & time: 07/12/22  1606  An emergency department physician performed an initial assessment on this suspected stroke patient at 1720.  History  Chief Complaint  Patient presents with   Numbness    Jason Mason is a 37 y.o. male.  HPI   37 year old male presenting to the emergency department with left-sided sensory deficits and left-sided weakness.  The patient states that he has had numbness of the left arm and left leg for the past 2 days.  He had acute onset of left-sided weakness and left facial numbness today at around 315.  He states his last full normal was 2 days ago.  Initially had stated last normal was 315 today on my evaluation.  Code stroke initiated but subsequently canceled.  The patient endorses a headache, located in a bandlike pattern across his head.  He denies any fevers or chills.  Home Medications Prior to Admission medications   Medication Sig Start Date End Date Taking? Authorizing Provider  ibuprofen (ADVIL,MOTRIN) 600 MG tablet Take 1 tablet (600 mg total) by mouth every 6 (six) hours as needed. Patient not taking: Reported on 07/12/2022 12/29/17   Enid Derry, PA-C  ondansetron (ZOFRAN-ODT) 4 MG disintegrating tablet Take 1 tablet (4 mg total) by mouth every 8 (eight) hours as needed for nausea or vomiting. Patient not taking: Reported on 07/12/2022 06/30/22   Marita Kansas, PA-C      Allergies    Patient has no known allergies.    Review of Systems   Review of Systems  Neurological:  Positive for weakness, numbness and headaches.  All other systems reviewed and are negative.   Physical Exam Updated Vital Signs BP 116/69   Pulse 64   Temp 98.2 F (36.8 C) (Oral)   Resp 20   SpO2 98%  Physical Exam Vitals and nursing note reviewed.  Constitutional:      General: He is not in acute distress.    Appearance: He is well-developed.  HENT:      Head: Normocephalic and atraumatic.  Eyes:     Conjunctiva/sclera: Conjunctivae normal.  Cardiovascular:     Rate and Rhythm: Normal rate and regular rhythm.     Heart sounds: No murmur heard. Pulmonary:     Effort: Pulmonary effort is normal. No respiratory distress.     Breath sounds: Normal breath sounds.  Abdominal:     Palpations: Abdomen is soft.     Tenderness: There is no abdominal tenderness.  Musculoskeletal:        General: No swelling.     Cervical back: Neck supple.  Skin:    General: Skin is warm and dry.     Capillary Refill: Capillary refill takes less than 2 seconds.  Neurological:     Mental Status: He is alert.     Comments: MENTAL STATUS EXAM:    Orientation: Alert and oriented to person, place and time.  Memory: Cooperative, follows commands well.  Language: Speech is clear and language is normal.   CRANIAL NERVES:    CN 2 (Optic): Visual fields intact to confrontation.  CN 3,4,6 (EOM): Pupils equal and reactive to light. Full extraocular eye movement without nystagmus.  CN 5 (Trigeminal): Facial sensation is normal, no weakness of masticatory muscles.  CN 7 (Facial): No facial weakness or asymmetry.  CN 8 (Auditory): Auditory acuity grossly normal.  CN 9,10 (Glossophar): The uvula is midline, the palate elevates  symmetrically.  CN 11 (spinal access): Normal sternocleidomastoid and trapezius strength.  CN 12 (Hypoglossal): The tongue is midline. No atrophy or fasciculations.Marland Kitchen   MOTOR:  Muscle Strength: 5/5RUE, 3/5LUE, 5/5RLE, 3/5LLE.   COORDINATION:   No tremor, unable to fully assess.   SENSATION:   Intact to light touch all four extremities, decreased sensation in the left hemibody.    Psychiatric:        Mood and Affect: Mood normal.     ED Results / Procedures / Treatments   Labs (all labs ordered are listed, but only abnormal results are displayed) Labs Reviewed  COMPREHENSIVE METABOLIC PANEL - Abnormal; Notable for the following  components:      Result Value   Potassium 3.4 (*)    Glucose, Bld 125 (*)    All other components within normal limits  I-STAT CHEM 8, ED - Abnormal; Notable for the following components:   Glucose, Bld 127 (*)    All other components within normal limits  CBG MONITORING, ED - Abnormal; Notable for the following components:   Glucose-Capillary 120 (*)    All other components within normal limits  PROTIME-INR  APTT  CBC  DIFFERENTIAL  ETHANOL  RAPID URINE DRUG SCREEN, HOSP PERFORMED  URINALYSIS, ROUTINE W REFLEX MICROSCOPIC    EKG EKG Interpretation  Date/Time:  Monday Jul 12 2022 16:01:31 EDT Ventricular Rate:  71 PR Interval:  132 QRS Duration: 100 QT Interval:  352 QTC Calculation: 382 R Axis:   79 Text Interpretation: Normal sinus rhythm Normal ECG When compared with ECG of 07-Aug-2016 12:55, PREVIOUS ECG IS PRESENT Confirmed by Ernie Avena ([id number]) on 07/12/2022 5:23:08 PM  Radiology MR BRAIN WO CONTRAST  Result Date: 07/12/2022 CLINICAL DATA:  Headache EXAM: MRI HEAD WITHOUT CONTRAST TECHNIQUE: Multiplanar, multiecho pulse sequences of the brain and surrounding structures were obtained without intravenous contrast. COMPARISON:  None Available. FINDINGS: Brain: No acute infarct, mass effect or extra-axial collection. No acute or chronic hemorrhage. Normal white matter signal, parenchymal volume and CSF spaces. The midline structures are normal. Vascular: Major flow voids are preserved. Skull and upper cervical spine: Normal calvarium and skull base. Visualized upper cervical spine and soft tissues are normal. Sinuses/Orbits:No paranasal sinus fluid levels or advanced mucosal thickening. No mastoid or middle ear effusion. Normal orbits. IMPRESSION: Normal brain MRI. Electronically Signed   By: Deatra Robinson M.D.   On: 07/12/2022 20:56   CT ANGIO HEAD NECK W WO CM W PERF (CODE STROKE)  Result Date: 07/12/2022 CLINICAL DATA:  Stroke suspected EXAM: CT ANGIOGRAPHY HEAD AND NECK CT  PERFUSION BRAIN TECHNIQUE: Multidetector CT imaging of the head and neck was performed using the standard protocol during bolus administration of intravenous contrast. Multiplanar CT image reconstructions and MIPs were obtained to evaluate the vascular anatomy. Carotid stenosis measurements (when applicable) are obtained utilizing NASCET criteria, using the distal internal carotid diameter as the denominator. Multiphase CT imaging of the brain was performed following IV bolus contrast injection. Subsequent parametric perfusion maps were calculated using RAPID software. RADIATION DOSE REDUCTION: This exam was performed according to the departmental dose-optimization program which includes automated exposure control, adjustment of the mA and/or kV according to patient size and/or use of iterative reconstruction technique. CONTRAST:  Iodinated contrast was used to improve disease detection COMPARISON:  None Available. FINDINGS: CT HEAD FINDINGS See same day CT head for intracranial findings. Post contrast-enhanced images are notable for a bundle vessels near the right sylvian fissure (series 6, image 89), which could represent  a small AV malformation. CTA NECK FINDINGS Aortic arch: Standard branching. Imaged portion shows no evidence of aneurysm or dissection. No significant stenosis of the major arch vessel origins. Right carotid system: No evidence of dissection, stenosis (50% or greater) or occlusion. Left carotid system: No evidence of dissection, stenosis (50% or greater) or occlusion. Vertebral arteries: Left dominant. No evidence of dissection, stenosis (50% or greater) or occlusion. Skeleton: Negative Other neck: Negative Upper chest: Negative Review of the MIP images confirms the above findings CTA HEAD FINDINGS Anterior circulation: No significant stenosis, proximal occlusion, aneurysm. There is tangle of vessels near the right sylvian fissure (series 6, image 89), which could represent a vascular  malformation. There is asymmetrically increased vascularity in the right Posterior circulation: No significant stenosis, proximal occlusion, aneurysm, or vascular malformation. The left SCA small in caliber. Venous sinuses: As permitted by contrast timing, patent. Anatomic variants: Hypoplastic P1 segment on the left. The left P2 segment is predominantly supplied by the left PCOM. Review of the MIP images confirms the above findings CT Brain Perfusion Findings: ASPECTS: 10 CBF (<30%) Volume: 0mL Perfusion (Tmax>6.0s) volume: 0mL Mismatch Volume: 0mL Infarction Location:No infarct. IMPRESSION: 1. Tangle of vessels near the right Sylvian fissure, which could represent a small AV malformation. 2. No intracranial large vessel occlusion or significant stenosis. 3. No hemodynamically significant stenosis in the neck. 4. No infarct core or penumbra by CT perfusion. Findings were paged to Dr. Amada Jupiter on 07/12/22 at 5:50 PM. Electronically Signed   By: Lorenza Cambridge M.D.   On: 07/12/2022 18:04   CT HEAD WO CONTRAST ( )  Result Date: 07/12/2022 CLINICAL DATA:  Stroke suspected EXAM: CT HEAD WITHOUT CONTRAST TECHNIQUE: Contiguous axial images were obtained from the base of the skull through the vertex without intravenous contrast. RADIATION DOSE REDUCTION: This exam was performed according to the departmental dose-optimization program which includes automated exposure control, adjustment of the mA and/or kV according to patient size and/or use of iterative reconstruction technique. COMPARISON:  None Available. FINDINGS: Brain: No evidence of acute infarction, hemorrhage, hydrocephalus, extra-axial collection or mass lesion/mass effect. Vascular: No hyperdense vessel. There is a rounded density at the right MCA bifurcation (series 3, image 13), which could represent a small aneurysm. Skull: Normal. Negative for fracture or focal lesion. Sinuses/Orbits: No middle ear or mastoid effusion. Paranasal sinuses are clear.  Orbits are unremarkable. Other: None. IMPRESSION: 1. No hemorrhage or CT evidence of an acute infarct. 2. Rounded density at the right MCA bifurcation, which could represent a small aneurysm. Findings were paged to Dr. Amada Jupiter on 07/12/22 at 5:44 PM. Electronically Signed   By: Lorenza Cambridge M.D.   On: 07/12/2022 17:45    Procedures Procedures    Medications Ordered in ED Medications  sodium chloride 0.9 % bolus 1,000 mL (0 mLs Intravenous Stopped 07/12/22 1906)    And  0.9 %  sodium chloride infusion (0 mLs Intravenous Paused 07/12/22 1929)  metoCLOPramide (REGLAN) injection 10 mg (10 mg Intravenous Given 07/12/22 1802)  diphenhydrAMINE (BENADRYL) injection 12.5 mg (12.5 mg Intravenous Given 07/12/22 1802)  iohexol (OMNIPAQUE) 350 MG/ML injection 100 mL (100 mLs Intravenous Contrast Given 07/12/22 1749)    ED Course/ Medical Decision Making/ A&P                             Medical Decision Making Amount and/or Complexity of Data Reviewed Labs: ordered. Radiology: ordered.  Risk Prescription drug management.   37 year old male  presenting to the emergency department with left-sided sensory deficits and left-sided weakness.  The patient states that he has had numbness of the left arm and left leg for the past 2 days.  He had acute onset of left-sided weakness and left facial numbness today at around 315.  He states his last full normal was 2 days ago.  Initially had stated last normal was 315 today on my evaluation.  Code stroke initiated but subsequently canceled.  The patient endorses a headache, located in a bandlike pattern across his head.  He denies any fevers or chills.  On arrival, the patient was vitally stable.  Code stroke initially called due to patient's initial report of last normal at 315 with left hemibody weakness however this was canceled as the patient states that he has had numbness for the past 2 days.  Differential diagnosis includes complicated migraine, CVA, psychogenic  etiology, or partial seizure.  CBG on arrival was 120.  Laboratory evaluation initiated to include CBC without a leukocytosis or anemia, CMP unremarkable with mild hypokalemia of 3.4, urinalysis collected and pending, UDS negative, ethanol level negative.  CT of the head without contrast and CT angiogram revealed: IMPRESSION:  1. No hemorrhage or CT evidence of an acute infarct.  2. Rounded density at the right MCA bifurcation, which could  represent a small aneurysm.    Findings were paged to Dr. Amada Jupiter on 07/12/22 at 5:44 PM.   CTA: IMPRESSION:  1. Tangle of vessels near the right Sylvian fissure, which could  represent a small AV malformation.  2. No intracranial large vessel occlusion or significant stenosis.  3. No hemodynamically significant stenosis in the neck.  4. No infarct core or penumbra by CT perfusion.    Findings were paged to Dr. Amada Jupiter on 07/12/22 at 5:50 PM.    Spoke with neurology, Dr. Amada Jupiter who agreed with plan for MRI brain, treatment with migraine cocktail.  MRI Brain: IMPRESSION:  Normal brain MRI.    Reassessment: The patient was ambulatory moving all his extremities.  He was tolerating oral intake.  Explained the reassuring workup to the patient, plan for outpatient follow-up with neurology.  Stable for discharge.   Final Clinical Impression(s) / ED Diagnoses Final diagnoses:  Complicated migraine  Weakness    Rx / DC Orders ED Discharge Orders          Ordered    Ambulatory referral to Neurology       Comments: An appointment is requested in approximately: 4 weeks   07/12/22 2113              Ernie Avena, MD 07/12/22 2114

## 2022-07-12 NOTE — ED Provider Triage Note (Signed)
Emergency Medicine Provider Triage Evaluation Note  Jason Mason , a 37 y.o. male  was evaluated in triage.  Pt complains of concerns for left upper extremity and left lower extremity numbness x 2 days.  Notes that he went to the chiropractor for his knee prior to onset of his symptoms. Denies any manipulation of the spine. Denies history of similar symptoms.  Denies chest pain, shortness of breath, neck pain.   Review of Systems  Positive:  Negative:   Physical Exam  BP (!) 130/91 (BP Location: Right Arm)   Pulse 74   Temp 98.2 F (36.8 C) (Oral)   Resp 16   SpO2 98%  Gen:   Awake, no distress   Resp:  Normal effort  MSK:   Moves extremities without difficulty  Other:  Decreased grip strength on left sided. Markedly decreased sensation to left upper extremity. Decreased strength noted to LUE and LLE. No spinal TTP.   Medical Decision Making  Medically screening exam initiated at 4:44 PM.  Appropriate orders placed.  MONTFORD BARSE was informed that the remainder of the evaluation will be completed by another provider, this initial triage assessment does not replace that evaluation, and the importance of remaining in the ED until their evaluation is complete.  4:45 PM - Discussed with RN that patient is in need of a room immediately. RN aware and working on room placement.    Kayela Humphres A, PA-C 07/12/22 1656

## 2022-07-12 NOTE — ED Triage Notes (Signed)
Pt via EMS reporting L sided pins and needles in his LUE and LLE x 2 days and this morning patient developed same feeling in L side of face. Describes feeling like a drill is drilling into him. Initial onset was on the way back home immediately after a chiropractor visit. Pt very limited in his ability to move from stretcher to triage recliner.

## 2022-07-12 NOTE — Code Documentation (Signed)
Stroke Response Nurse Documentation Code Documentation  Jason Mason is a 37 y.o. male arriving to [facility]  via  EMS  on 07/12/22 with past medical hx of unknown. On No antithrombotic. Code stroke was activated by ED.   Patient developed left-sided numbness after his chiropractic treatment to his knee two days ago. Today at 1500 he noticed left sided weakness and new facial numbness. He called EMS and was taken to [facility]. ED activated code stroke after hearing about the new facial numbness and weakness. BP 130/91.   Stroke team at the bedside on patient arrival. Labs drawn and patient cleared for CT by Dr. Karene Fry. Patient to CT with team. NIHSS 11, see documentation for details and code stroke times. Patient with left hemianopia, left arm weakness, left leg weakness, left decreased sensation, and Visual  neglect on exam. The following imaging was completed:  CT Head, CTA, and CTP. Patient is not a candidate for IV Thrombolytic due to outside the window for treatment. Patient is not a candidate for IR due to no LVO on scans per Neurologist.   Care Plan: MRI, Q2 neuro checks, permissive HTN 220/120.   Bedside handoff with ED RN Cassandra.    Modena Slater  Stroke Response RN [phone]

## 2022-07-12 NOTE — Discharge Instructions (Addendum)
Your CT imaging and MRI imaging was negative for acute stroke or other abnormality intracranially.  This is a very reassuring finding.  Your symptoms are very consistent with a likely complicated migraine which has since resolved with a migraine cocktail.  A referral has been placed for follow-up outpatient with neurology.

## 2022-07-12 NOTE — Consult Note (Signed)
Neurology Consultation  Reason for Consult: Left-sided sensory deficits, left-sided weakness, concern for stroke Referring Physician: Dr. Karene Fry  CC: Numbness on the left arm, left leg for 2 days, acute onset of left sided weakness and left facial numbness today  History is obtained from: Patient  HPI: Jason Mason is a 37 y.o. male with no significant past medical history who presents to the ED on 07/12/2022 for evaluation of acute onset of left-sided weakness and left facial numbness.  Patient reports that approximately 2 days ago, he went to the chiropractor for manipulation of his left knee.  On his way home from the chiropractor, patient had sudden onset of left arm and leg tingling that has been constant since onset.  Patient said he woke up in his usual state of health, took his stepdaughter to school, dressed himself, and only complained of ongoing left-sided sensory disturbance.  Today, right after 3 PM, the patient said he had sudden onset of left facial sensory disturbance and left-sided weakness prompting EMS activation.  On arrival to the ED, patient last known well was unclear and a code stroke was subsequently activated and then canceled due to last known well being 2 days ago.  Neurology evaluated patient while in the ED following code stroke activation due to sudden onset of symptom worsening.  Patient does endorse a slight frontal headache but denies any history of migraine headaches or visual disturbances.  LKW: 2 days PTA, 07/10/22 with acute worsening of symptoms today just after 3 PM TNK given?: no, patient presented outside of the thrombolytic therapy time window IR Thrombectomy? No, vessel imaging reviewed without evidence of LVO Modified Rankin Scale: 0-Completely asymptomatic and back to baseline post- stroke  ROS: A complete ROS was performed and is negative except as noted in the HPI.   No past medical history on file.  Family History  Problem Relation Age of Onset    Diabetes Mother    Diabetes Father    Hypertension Father    Cancer Maternal Grandmother        bone marrow cancer   Cancer Maternal Grandfather        bone marrow cancer   Social History:   reports that he has never smoked. He has never used smokeless tobacco. He reports current alcohol use. He reports that he does not use drugs.  Medications  Current Facility-Administered Medications:    [COMPLETED] sodium chloride 0.9 % bolus 1,000 mL, 1,000 mL, Intravenous, Once, Last Rate: 999 mL/hr at 07/12/22 1805, 1,000 mL at 07/12/22 1805 **AND** 0.9 %  sodium chloride infusion, , Intravenous, Continuous, Ernie Avena, MD, Last Rate: 125 mL/hr at 07/12/22 1803, New Bag at 07/12/22 1803  Current Outpatient Medications:    ibuprofen (ADVIL,MOTRIN) 600 MG tablet, Take 1 tablet (600 mg total) by mouth every 6 (six) hours as needed., Disp: 30 tablet, Rfl: 0   ondansetron (ZOFRAN-ODT) 4 MG disintegrating tablet, Take 1 tablet (4 mg total) by mouth every 8 (eight) hours as needed for nausea or vomiting., Disp: 20 tablet, Rfl: 0  Exam: Current vital signs: BP (!) 130/91 (BP Location: Right Arm)   Pulse 74   Temp 98.2 F (36.8 C) (Oral)   Resp 16   SpO2 98%  Vital signs in last 24 hours: Temp:  [98.2 F (36.8 C)] 98.2 F (36.8 C) (05/06 1619) Pulse Rate:  [74] 74 (05/06 1619) Resp:  [16] 16 (05/06 1619) BP: (130)/(91) 130/91 (05/06 1619) SpO2:  [98 %] 98 % (05/06 1619)  GENERAL: Awake, alert, prefers to speak with his eyes closed Psych: Affect appropriate for situation, patient is calm and cooperative with examination Head: Normocephalic and atraumatic, without obvious abnormality EENT: Normal conjunctivae, dry mucous membranes, no OP obstruction. Patient does have a clenched jaw and minimal jaw movement with speaking. Patient endorses mouth pain.  LUNGS: Normal respiratory effort. Non-labored breathing on room air CV: Regular rate and rhythm on telemetry ABDOMEN: Soft, non-tender,  non-distended Extremities: Warm, well perfused, without obvious deformity  NEURO:  Mental Status: Awake, alert, and oriented to person, place, time, and situation. He is able to provide a clear and coherent history of present illness. Speech/Language: speech is fluent without dysarthria. Naming, repetition, and comprehension intact without aphasia  Cranial Nerves:  II: PERRL. Left homonymous hemianopia III, IV, VI: EOMI without gaze preference V: Decrease sensation to the left face.  VII: Face is symmetric resting and with movement VIII: Hearing intact to voice IX, X: Palate elevation is symmetric. Phonation normal.  XI: Decreased shoulder elevation with shoulder shrug on the left. XII: Tongue protrudes midline Motor: Patient is able to elevate right upper and lower extremity antigravity without vertical drift With passive elevation of left upper and lower extremities, extremities fall straight to the bed.  During assessment, patient does have some noted resistance of his left upper extremity with passive elevation.  There is some possible functional overlay to patient's left hemiaplasia. Tone is normal. Bulk is normal.  Sensation: Reports absent sensation on the left upper and lower extremity, no response to noxious stimuli on the left. Coordination: Does not perform in the left. Gait: Deferred  NIHSS: 1a Level of Conscious.: 0 1b LOC Questions: 0 1c LOC Commands: 0 2 Best Gaze: 0 3 Visual: 2 4 Facial Palsy: 0 5a Motor Arm - left: 3 5b Motor Arm - Right: 0 6a Motor Leg - Left: 3 6b Motor Leg - Right: 0 7 Limb Ataxia: 0 8 Sensory: 2 9 Best Language: 0 10 Dysarthria: 0 11 Extinct. and Inatten.: 1 TOTAL: 11  Labs I have reviewed labs in epic and the results pertinent to this consultation are: CBC    Component Value Date/Time   WBC 9.3 07/12/2022 1630   RBC 5.12 07/12/2022 1630   HGB 14.3 07/12/2022 1642   HGB 15.4 03/21/2017 0958   HCT 42.0 07/12/2022 1642   HCT 44.5  03/21/2017 0958   PLT 223 07/12/2022 1630   PLT 220 03/21/2017 0958   MCV 80.5 07/12/2022 1630   MCV 81 03/21/2017 0958   MCH 27.5 07/12/2022 1630   MCHC 34.2 07/12/2022 1630   RDW 11.9 07/12/2022 1630   RDW 13.2 03/21/2017 0958   LYMPHSABS 1.9 07/12/2022 1630   LYMPHSABS 2.2 03/21/2017 0958   MONOABS 0.4 07/12/2022 1630   EOSABS 0.1 07/12/2022 1630   EOSABS 0.1 03/21/2017 0958   BASOSABS 0.0 07/12/2022 1630   BASOSABS 0.0 03/21/2017 0958   CMP     Component Value Date/Time   NA 140 07/12/2022 1642   NA 140 03/21/2017 0958   K 3.6 07/12/2022 1642   CL 104 07/12/2022 1642   CO2 25 07/12/2022 1630   GLUCOSE 127 (H) 07/12/2022 1642   BUN 9 07/12/2022 1642   BUN 14 03/21/2017 0958   CREATININE 0.90 07/12/2022 1642   CALCIUM 9.4 07/12/2022 1630   PROT 7.4 07/12/2022 1630   PROT 7.4 03/21/2017 0958   ALBUMIN 4.3 07/12/2022 1630   ALBUMIN 4.6 03/21/2017 0958   AST 21 07/12/2022 1630  ALT 21 07/12/2022 1630   ALKPHOS 50 07/12/2022 1630   BILITOT 1.2 07/12/2022 1630   BILITOT 0.8 03/21/2017 0958   GFRNONAA >60 07/12/2022 1630   GFRAA 111 03/21/2017 0958   Lipid Panel     Component Value Date/Time   CHOL 240 (H) 03/16/2018 0945   TRIG 66 03/16/2018 0945   HDL 46 03/16/2018 0945   CHOLHDL 3.8 03/21/2017 0958   LDLCALC 181 (H) 03/16/2018 0945   Imaging I have reviewed the images obtained:  CT-scan of the brain 5/6: 1. No hemorrhage or CT evidence of an acute infarct. 2. Rounded density at the right MCA bifurcation, which could represent a small aneurysm.  CT angiography with perfusion 5/6: 1. Tangle of vessels near the right Sylvian fissure, which could represent a small AV malformation. 2. No intracranial large vessel occlusion or significant stenosis. 3. No hemodynamically significant stenosis in the neck. 4. No infarct core or penumbra by CT perfusion.  Assessment: 37 year old male with no significant past medical history who presents to the ED with 2 days of  left upper and lower extremity sensory deficit with abrupt onset of left facial numbness and left upper and lower extremity weakness.  Patient is not a TNK candidate as his last known well was 2 days prior to arrival.  CT angiography and perfusion imaging ordered due to exam findings concerning for LVO with abrupt worsening of symptoms at approximately 3 PM today.  Vessel imaging obtained without intracranial LVO or significant stenosis, no core infarct or penumbra by CT perfusion.  Impression: Stroke versus functional presentation versus hemiplegic migraine headache  Recommendations: - MRI brain when able to obtain  - Migraine cocktail - Will expand full stroke work up if MRI brain + for acute ischemia - Permissive hypertension until stroke ruled out by MRI brain   Pt seen by NP/Neuro and later by MD. Note/plan to be edited by MD as needed.  Lanae Boast, AGAC-NP [facility] Pager: [phone]  I have seen the patient reviewed the above note.  Based on the presence of positive symptoms (tingling) coupled with headache, I do think that complicated migraine is a significant possibility.  I would favor treating for this, but other more serious etiologies need to be ruled out.  An MRI brain would rule out stroke given the presence of sensory symptoms for at least 2 days.  He is not a TNK candidate given his last known well, and is not a thrombectomy candidate given no target on CTA.  Seizure could also be a consideration and if he does not improve with migraine cocktail, may need to consider observation to obtain an EEG.  If MRI is negative and he does improve with migraine cocktail, then I do not think he would need any further evaluation following that.  Neurology will follow if admitted.  Ritta Slot, MD [facility] [phone]  If 7pm- 7am, please page neurology on call as listed in AMION.

## 2022-07-12 NOTE — ED Notes (Signed)
EM provider notified this RN of code stroke and this RN called CT to obtain room.

## 2022-08-16 ENCOUNTER — Ambulatory Visit: Payer: Self-pay | Admitting: Diagnostic Neuroimaging

## 2022-08-16 ENCOUNTER — Encounter: Payer: Self-pay | Admitting: Diagnostic Neuroimaging

## 2022-10-23 ENCOUNTER — Encounter: Payer: Self-pay | Admitting: Emergency Medicine

## 2022-10-23 ENCOUNTER — Inpatient Hospital Stay: Payer: 59

## 2022-10-23 ENCOUNTER — Emergency Department: Payer: 59

## 2022-10-23 ENCOUNTER — Ambulatory Visit
Admission: EM | Admit: 2022-10-23 | Discharge: 2022-10-23 | Disposition: A | Discharge status: Home / Self Care | Payer: 59

## 2022-10-23 ENCOUNTER — Other Ambulatory Visit: Payer: Self-pay

## 2022-10-23 ENCOUNTER — Inpatient Hospital Stay
Admission: EM | Admit: 2022-10-23 | Discharge: 2022-10-29 | DRG: 603 | Disposition: A | Discharge status: Home / Self Care | Payer: 59 | Attending: Internal Medicine | Admitting: Internal Medicine

## 2022-10-23 DIAGNOSIS — B9561 Methicillin susceptible Staphylococcus aureus infection as the cause of diseases classified elsewhere: Secondary | ICD-10-CM | POA: Diagnosis present

## 2022-10-23 DIAGNOSIS — M71062 Abscess of bursa, left knee: Secondary | ICD-10-CM | POA: Diagnosis not present

## 2022-10-23 DIAGNOSIS — A419 Sepsis, unspecified organism: Secondary | ICD-10-CM | POA: Diagnosis not present

## 2022-10-23 DIAGNOSIS — M71162 Other infective bursitis, left knee: Secondary | ICD-10-CM | POA: Diagnosis present

## 2022-10-23 DIAGNOSIS — L0390 Cellulitis, unspecified: Secondary | ICD-10-CM | POA: Diagnosis not present

## 2022-10-23 DIAGNOSIS — M009 Pyogenic arthritis, unspecified: Secondary | ICD-10-CM | POA: Diagnosis present

## 2022-10-23 DIAGNOSIS — M25562 Pain in left knee: Secondary | ICD-10-CM | POA: Diagnosis present

## 2022-10-23 DIAGNOSIS — Z8249 Family history of ischemic heart disease and other diseases of the circulatory system: Secondary | ICD-10-CM | POA: Diagnosis not present

## 2022-10-23 DIAGNOSIS — L03116 Cellulitis of left lower limb: Principal | ICD-10-CM

## 2022-10-23 LAB — COMPREHENSIVE METABOLIC PANEL
ALT: 25 U/L (ref 0–44)
AST: 18 U/L (ref 15–41)
Albumin: 4.5 g/dL (ref 3.5–5.0)
Alkaline Phosphatase: 54 U/L (ref 38–126)
Anion gap: 9 (ref 5–15)
BUN: 10 mg/dL (ref 6–20)
CO2: 23 mmol/L (ref 22–32)
Calcium: 9 mg/dL (ref 8.9–10.3)
Chloride: 100 mmol/L (ref 98–111)
Creatinine, Ser: 1.01 mg/dL (ref 0.61–1.24)
GFR, Estimated: >60 mL/min (ref >60)
Glucose, Bld: 132 mg/dL — ABNORMAL HIGH (ref 70–99)
Potassium: 3.8 mmol/L (ref 3.5–5.1)
Sodium: 132 mmol/L — ABNORMAL LOW (ref 135–145)
Total Bilirubin: 1.7 mg/dL — ABNORMAL HIGH (ref 0.3–1.2)
Total Protein: 8.3 g/dL — ABNORMAL HIGH (ref 6.5–8.1)

## 2022-10-23 LAB — CBC WITH DIFFERENTIAL/PLATELET
Abs Immature Granulocytes: 0.12 10*3/uL — ABNORMAL HIGH (ref 0.00–0.07)
Basophils Absolute: 0 10*3/uL (ref 0.0–0.1)
Basophils Relative: 0 %
Eosinophils Absolute: 0 10*3/uL (ref 0.0–0.5)
Eosinophils Relative: 0 %
HCT: 46.1 % (ref 39.0–52.0)
Hemoglobin: 15 g/dL (ref 13.0–17.0)
Immature Granulocytes: 1 %
Lymphocytes Relative: 5 %
Lymphs Abs: 1 10*3/uL (ref 0.7–4.0)
MCH: 27.2 pg (ref 26.0–34.0)
MCHC: 32.5 g/dL (ref 30.0–36.0)
MCV: 83.5 fL (ref 80.0–100.0)
Monocytes Absolute: 1.4 10*3/uL — ABNORMAL HIGH (ref 0.1–1.0)
Monocytes Relative: 6 %
Neutro Abs: 19.3 10*3/uL — ABNORMAL HIGH (ref 1.7–7.7)
Neutrophils Relative %: 88 %
Platelets: 210 10*3/uL (ref 150–400)
RBC: 5.52 MIL/uL (ref 4.22–5.81)
RDW: 11.9 % (ref 11.5–15.5)
WBC: 21.8 10*3/uL — ABNORMAL HIGH (ref 4.0–10.5)
nRBC: 0 % (ref 0.0–0.2)

## 2022-10-23 LAB — PROCALCITONIN: Procalcitonin: 0.17 ng/mL

## 2022-10-23 LAB — LACTIC ACID, PLASMA
Lactic Acid, Venous: 1.6 mmol/L (ref 0.5–1.9)
Lactic Acid, Venous: 1.4 mmol/L (ref 0.5–1.9)
Lactic Acid, Venous: 1 mmol/L (ref 0.5–1.9)

## 2022-10-23 LAB — C-REACTIVE PROTEIN: CRP: 29.5 mg/dL — ABNORMAL HIGH (ref <1.0)

## 2022-10-23 LAB — CK: Total CK: 50 U/L (ref 49–397)

## 2022-10-23 LAB — URIC ACID: Uric Acid, Serum: 4.8 mg/dL (ref 3.7–8.6)

## 2022-10-23 LAB — SEDIMENTATION RATE: Sed Rate: 20 mm/hr — ABNORMAL HIGH (ref 0–15)

## 2022-10-23 MED ORDER — LACTATED RINGERS IV SOLN: INTRAVENOUS | Status: DC

## 2022-10-23 MED ORDER — OXYCODONE HCL 5 MG PO TABS
5.0000 mg | ORAL_TABLET | ORAL | Status: DC | PRN
  Administered 2022-10-24 – 2022-10-29 (×7): 5 mg via ORAL
  Filled 2022-10-23 (×8): qty 1

## 2022-10-23 MED ORDER — SODIUM CHLORIDE 0.9 % IV SOLN
2.0000 g | INTRAVENOUS | Status: DC
  Administered 2022-10-24 – 2022-10-27 (×4): 2 g via INTRAVENOUS
  Filled 2022-10-23 (×4): qty 20

## 2022-10-23 MED ORDER — ACETAMINOPHEN 325 MG PO TABS
650.0000 mg | ORAL_TABLET | Freq: Four times a day (QID) | ORAL | Status: DC | PRN
  Administered 2022-10-23 – 2022-10-26 (×5): 650 mg via ORAL
  Filled 2022-10-23 (×5): qty 2

## 2022-10-23 MED ORDER — ONDANSETRON HCL 4 MG PO TABS: 4.0000 mg | ORAL_TABLET | Freq: Four times a day (QID) | ORAL | Status: DC | PRN

## 2022-10-23 MED ORDER — ACETAMINOPHEN 650 MG RE SUPP: 650.0000 mg | Freq: Four times a day (QID) | RECTAL | Status: DC | PRN

## 2022-10-23 MED ORDER — VANCOMYCIN HCL 500 MG/100ML IV SOLN
500.0000 mg | Freq: Once | INTRAVENOUS | Status: AC
  Administered 2022-10-23: 500 mg via INTRAVENOUS
  Filled 2022-10-23: qty 100

## 2022-10-23 MED ORDER — GADOBUTROL 1 MMOL/ML IV SOLN
10.0000 mL | Freq: Once | INTRAVENOUS | Status: AC | PRN
  Administered 2022-10-23: 10 mL via INTRAVENOUS

## 2022-10-23 MED ORDER — LIDOCAINE HCL (PF) 1 % IJ SOLN
30.0000 mL | Freq: Once | INTRAMUSCULAR | Status: AC
  Administered 2022-10-23: 5 mL
  Filled 2022-10-23: qty 30

## 2022-10-23 MED ORDER — MORPHINE SULFATE (PF) 4 MG/ML IV SOLN
4.0000 mg | Freq: Once | INTRAVENOUS | Status: AC
  Administered 2022-10-23: 4 mg via INTRAVENOUS
  Filled 2022-10-23: qty 1

## 2022-10-23 MED ORDER — HEPARIN SODIUM (PORCINE) 5000 UNIT/ML IJ SOLN
5000.0000 [IU] | Freq: Three times a day (TID) | INTRAMUSCULAR | Status: DC
  Administered 2022-10-23 – 2022-10-24 (×3): 5000 [IU] via SUBCUTANEOUS
  Filled 2022-10-23 (×3): qty 1

## 2022-10-23 MED ORDER — SODIUM CHLORIDE 0.9 % IV SOLN
2.0000 g | Freq: Once | INTRAVENOUS | Status: AC
  Administered 2022-10-23: 2 g via INTRAVENOUS
  Filled 2022-10-23: qty 20

## 2022-10-23 MED ORDER — ACETAMINOPHEN 500 MG PO TABS
1000.0000 mg | ORAL_TABLET | Freq: Once | ORAL | Status: AC
  Administered 2022-10-23: 1000 mg via ORAL
  Filled 2022-10-23: qty 2

## 2022-10-23 MED ORDER — VANCOMYCIN HCL 2000 MG/400ML IV SOLN
2000.0000 mg | Freq: Once | INTRAVENOUS | Status: AC
  Administered 2022-10-23: 2000 mg via INTRAVENOUS
  Filled 2022-10-23: qty 400

## 2022-10-23 MED ORDER — KETOROLAC TROMETHAMINE 30 MG/ML IJ SOLN
30.0000 mg | Freq: Four times a day (QID) | INTRAMUSCULAR | Status: AC | PRN
  Administered 2022-10-24 – 2022-10-26 (×2): 30 mg via INTRAVENOUS
  Filled 2022-10-23 (×2): qty 1

## 2022-10-23 MED ORDER — SODIUM CHLORIDE 0.9 % IV BOLUS
1000.0000 mL | Freq: Once | INTRAVENOUS | Status: AC
  Administered 2022-10-23: 1000 mL via INTRAVENOUS

## 2022-10-23 MED ORDER — VANCOMYCIN HCL IN DEXTROSE 1-5 GM/200ML-% IV SOLN
1000.0000 mg | Freq: Two times a day (BID) | INTRAVENOUS | Status: DC
  Administered 2022-10-24: 1000 mg via INTRAVENOUS
  Filled 2022-10-23: qty 200

## 2022-10-23 MED ORDER — ONDANSETRON HCL 4 MG/2ML IJ SOLN: 4.0000 mg | Freq: Four times a day (QID) | INTRAMUSCULAR | Status: DC | PRN

## 2022-10-23 MED ORDER — ALBUTEROL SULFATE (2.5 MG/3ML) 0.083% IN NEBU: 2.5000 mg | INHALATION_SOLUTION | RESPIRATORY_TRACT | Status: DC | PRN

## 2022-10-23 MED ORDER — IOHEXOL 350 MG/ML SOLN
100.0000 mL | Freq: Once | INTRAVENOUS | Status: AC | PRN
  Administered 2022-10-23: 100 mL via INTRAVENOUS

## 2022-10-23 NOTE — Discharge Instructions (Addendum)
Recommend patient be evaluated at the emergency department due to possible left knee septic joint

## 2022-10-23 NOTE — Consult Note (Signed)
ORTHOPAEDIC CONSULTATION  REQUESTING PHYSICIAN: Lurline Del, MD  Chief Complaint: Redness and swelling left leg  HPI: Jason Mason is a 37 y.o. male who complains of redness and swelling left leg for the last 3 days.  He does not recall any injury or or other trauma to his leg.  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  He usually wears with kneepads.  He came to the emergency room today with increased pain swelling and fever.  His white count was elevated 21,000.  Sed rate is 20.  His temperature is gone up to 103 F. Dr. Anner Crete tapped the knee in the emergency room and did not find any significant fluid.  The knee and lower leg are swollen warm with some redness.  Prepatellar bursa appears slightly swollen.  I suspect that he started with an infection here which is red.  The knee itself moves fairly well without much pain.  CT of the leg showed very small abscess in the prepatellar bursal region.  History reviewed. No pertinent past medical history. Past Surgical History:  Procedure Laterality Date   ANTERIOR CRUCIATE LIGAMENT REPAIR Right    Social History   Socioeconomic History   Marital status: Married    Spouse name: Not on file   Number of children: Not on file   Years of education: Not on file   Highest education level: Not on file  Occupational History   Not on file  Tobacco Use   Smoking status: Never   Smokeless tobacco: Never  Vaping Use   Vaping status: Never Used  Substance and Sexual Activity   Alcohol use: Yes    Alcohol/week: 0.0 standard drinks of alcohol   Drug use: No   Sexual activity: Yes    Partners: Female  Other Topics Concern   Not on file  Social History Narrative   Not on file   Social Determinants of Health   Financial Resource Strain: Not on file  Food Insecurity: No Food Insecurity (10/23/2022)   Hunger Vital Sign    Worried About Running Out of Food in the Last Year: Never true    Ran Out of  Food in the Last Year: Never true  Transportation Needs: No Transportation Needs (10/23/2022)   PRAPARE - Administrator, Civil Service (Medical): No    Lack of Transportation (Non-Medical): No  Physical Activity: Not on file  Stress: Not on file  Social Connections: Not on file   Family History  Problem Relation Age of Onset   Diabetes Mother    Diabetes Father    Hypertension Father    Cancer Maternal Grandmother        bone marrow cancer   Cancer Maternal Grandfather        bone marrow cancer   No Known Allergies Prior to Admission medications   Medication Sig Start Date End Date Taking? Authorizing Provider  ibuprofen (ADVIL,MOTRIN) 600 MG tablet Take 1 tablet (600 mg total) by mouth every 6 (six) hours as needed. 12/29/17  Yes Enid Derry, PA-C  ondansetron (ZOFRAN-ODT) 4 MG disintegrating tablet Take 1 tablet (4 mg total) by mouth every 8 (eight) hours as needed for nausea or vomiting. Patient not taking: Reported on 07/12/2022 06/30/22   Marita Kansas, PA-C   CT EXTREMITY LOWER LEFT W CONTRAST  Result Date: 10/23/2022 CLINICAL DATA:  Left knee cellulitis. EXAM: CT OF THE LOWER LEFT EXTREMITY WITH CONTRAST TECHNIQUE: Multidetector CT  imaging of the lower left extremity was performed according to the standard protocol following intravenous contrast administration. RADIATION DOSE REDUCTION: This exam was performed according to the departmental dose-optimization program which includes automated exposure control, adjustment of the mA and/or kV according to patient size and/or use of iterative reconstruction technique. CONTRAST:  OMNIPAQUE IOHEXOL 350 MG/ML SOLN COMPARISON:  None Available. FINDINGS: Bones/Joint/Cartilage No fracture or dislocation. Normal alignment. No joint effusion. Ligaments Ligaments are suboptimally evaluated by CT. Muscles and Tendons Muscles are normal. No muscle atrophy. No intramuscular fluid collection or hematoma. Patellar tendon and quadriceps  tendon are intact. Fragmentation of the tibial tuberosity at the patellar tendon insertion as can be seen with old Osgood-Schlatter disease. Soft tissue No soft tissue mass. Soft tissue edema in the subcutaneous fat involving the distal thigh extending into the lower leg most severe along the anterolateral aspect of the knee with overlying skin thickening most concerning for cellulitis. 2.2 x 1.1 x 3 cm fluid collection superficial to the tibial tuberosity consistent with an abscess. Normal neurovascular bundles. IMPRESSION: 1. Soft tissue edema in the subcutaneous fat involving the distal thigh extending into the lower leg most severe along the anterolateral aspect of the knee with overlying skin thickening most concerning for cellulitis. A 2.2 x 1.1 x 3 cm fluid collection superficial to the tibial tuberosity consistent with an abscess. Electronically Signed   By: Elige Ko M.D.   On: 10/23/2022 17:29   DG Knee 2 Views Left  Result Date: 10/23/2022 CLINICAL DATA:  Swelling.  Possible septic joint.  No injury. EXAM: LEFT KNEE - 1-2 VIEW COMPARISON:  None Available. FINDINGS: There is significant anterior knee soft tissue swelling. There is evidence of old Osgood-Schlatter's disease. There is no acute fracture. Joint spaces are well maintained. No cortical erosions are identified. Small knee joint effusion. IMPRESSION: 1. Significant anterior knee soft tissue swelling. Small knee joint effusion. 2. No acute osseous abnormality. Electronically Signed   By: Darliss Cheney M.D.   On: 10/23/2022 15:18    Positive ROS: All other systems have been reviewed and were otherwise negative with the exception of those mentioned in the HPI and as above.  Physical Exam: General: Alert, no acute distress Cardiovascular: No pedal edema Respiratory: No cyanosis, no use of accessory musculature GI: No organomegaly, abdomen is soft and non-tender Skin: No lesions in the area of chief complaint Neurologic: Sensation  intact distally Psychiatric: Patient is competent for consent with normal mood and affect Lymphatic: No axillary or cervical lymphadenopathy  MUSCULOSKELETAL: Redness and swelling around the left knee and leg.  There are some redness above the knee laterally.  Neurovascular status good distally.  Passive motion of the knee is not very painful.  Prepatellar bursa is mildly swollen.  The right leg is normal.  Upper extremities are normal.  Assessment: Cellulitis left leg secondary to prepatellar bursal infection  Plan: Continue IV antibiotics. May require I&D of the prepatellar bursa at a later date.    Valinda Hoar, MD [phone]   10/23/2022 8:21 PM

## 2022-10-23 NOTE — ED Provider Notes (Signed)
[facility] Provider Note    Event Date/Time   First MD Initiated Contact with Patient 10/23/22 1442     (approximate)   History   Knee Pain   HPI Jason Mason is a 37 y.o. male presenting today with left knee pain and swelling.  Patient reports symptoms started 3 days ago with swelling, redness, and pain.  It has progressed rapidly since then and he describes as 10/10 pain.  He is no longer able to ambulate on it.  He is also started feeling feverish with chills.  Notes redness is starting to track up the outside of his left thigh.  Denies any obvious known tick bites.  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     Physical Exam   Triage Vital Signs: ED Triage Vitals  Encounter Vitals Group     BP 10/23/22 1420 138/84     Systolic BP Percentile --      Diastolic BP Percentile --      Pulse Rate 10/23/22 1420 94     Resp 10/23/22 1420 17     Temp 10/23/22 1420 (!) 100.6 F (38.1 C)     Temp Source 10/23/22 1420 Oral     SpO2 10/23/22 1420 100 %     Weight 10/23/22 1421 230 lb (104.3 kg)     Height --      Head Circumference --      Peak Flow --      Pain Score 10/23/22 1421 10     Pain Loc --      Pain Education --      Exclude from Growth Chart --     Most recent vital signs: Vitals:   10/23/22 1634 10/23/22 1645  BP:  119/67  Pulse:  94  Resp:    Temp: (!) 103 F (39.4 C)   SpO2:  99%   I have reviewed the vital signs. General:  Awake, alert, no acute distress. Head:  Normocephalic, Atraumatic. EENT:  PERRL, EOMI, Oral mucosa pink and moist, Neck is supple. Cardiovascular: Regular rate, 2+ distal pulses. Respiratory:  Normal respiratory effort, symmetrical expansion, no distress.   Extremities: Left knee hot, erythematous, and edematous.  Erythema tracking up the lateral thigh as well as down the lateral lower leg. Neuro:  Alert and oriented.  Interacting appropriately.    Skin:  Warm, dry, no rash.   Psych: Appropriate affect.     ED Results / Procedures / Treatments   Labs (all labs ordered are listed, but only abnormal results are displayed) Labs Reviewed  CBC WITH DIFFERENTIAL/PLATELET - Abnormal; Notable for the following components:      Result Value   WBC 21.8 (*)    Neutro Abs 19.3 (*)    Monocytes Absolute 1.4 (*)    Abs Immature Granulocytes 0.12 (*)    All other components within normal limits  COMPREHENSIVE METABOLIC PANEL - Abnormal; Notable for the following components:   Sodium 132 (*)    Glucose, Bld 132 (*)    Total Protein 8.3 (*)    Total Bilirubin 1.7 (*)    All other components within normal limits  SEDIMENTATION RATE - Abnormal; Notable for the following components:   Sed Rate 20 (*)    All other components within normal limits  CULTURE, BLOOD (ROUTINE X 2)  CULTURE, BLOOD (ROUTINE X 2)  BODY FLUID CULTURE W GRAM STAIN  EXPECTORATED SPUTUM ASSESSMENT  W GRAM STAIN, RFLX TO RESP C  CHLAMYDIA/NGC RT PCR ([facility] ONLY)            LACTIC ACID, PLASMA  LACTIC ACID, PLASMA  URIC ACID  GLUCOSE, BODY FLUID OTHER            PROTEIN, BODY FLUID (OTHER)  SYNOVIAL CELL COUNT + DIFF, W/ CRYSTALS  LYME DISEASE SEROLOGY W/REFLEX  C-REACTIVE PROTEIN  HIV ANTIBODY (ROUTINE TESTING W REFLEX)  CBC  CREATININE, SERUM  COMPREHENSIVE METABOLIC PANEL  CBC  C-REACTIVE PROTEIN     EKG    RADIOLOGY X-ray of the left knee shows soft tissue swelling but no obvious osseous injuries.   PROCEDURES:  Critical Care performed: No  .Joint Aspiration/Arthrocentesis  Date/Time: 10/23/2022 5:03 PM  Performed by: Janith Lima, MD Authorized by: Janith Lima, MD   Consent:    Consent obtained:  Verbal   Consent given by:  Patient   Risks, benefits, and alternatives were discussed: yes     Risks discussed:  Bleeding, infection and pain Universal protocol:    Patient identity confirmed:  Verbally with patient Location:     Location:  Knee   Knee:  L knee Anesthesia:    Anesthesia method:  Local infiltration   Local anesthetic:  Lidocaine 1% w/o epi Procedure details:    Preparation: Patient was prepped and draped in usual sterile fashion     Needle gauge:  18 G   Ultrasound guidance: no     Approach:  Medial   Aspirate amount:  2cc   Aspirate characteristics:  Yellow   Steroid injected: no     Specimen collected: yes   Post-procedure details:    Dressing:  Adhesive bandage   Procedure completion:  Tolerated well, no immediate complications    MEDICATIONS ORDERED IN ED: Medications  cefTRIAXone (ROCEPHIN) 2 g in sodium chloride 0.9 % 100 mL IVPB (2 g Intravenous New Bag/Given 10/23/22 1638)  iohexol (OMNIPAQUE) 350 MG/ML injection 100 mL (has no administration in time range)  heparin injection 5,000 Units (has no administration in time range)  lactated ringers infusion (has no administration in time range)  acetaminophen (TYLENOL) tablet 650 mg (has no administration in time range)    Or  acetaminophen (TYLENOL) suppository 650 mg (has no administration in time range)  ketorolac (TORADOL) 30 MG/ML injection 30 mg (has no administration in time range)  oxyCODONE (Oxy IR/ROXICODONE) immediate release tablet 5 mg (has no administration in time range)  ondansetron (ZOFRAN) tablet 4 mg (has no administration in time range)    Or  ondansetron (ZOFRAN) injection 4 mg (has no administration in time range)  albuterol (PROVENTIL) (2.5 MG/3ML) 0.083% nebulizer solution 2.5 mg (has no administration in time range)  lidocaine (PF) (XYLOCAINE) 1 % injection 30 mL (5 mLs Infiltration Given 10/23/22 1548)  morphine (PF) 4 MG/ML injection 4 mg (4 mg Intravenous Given 10/23/22 1545)  sodium chloride 0.9 % bolus 1,000 mL (1,000 mLs Intravenous New Bag/Given 10/23/22 1636)  acetaminophen (TYLENOL) tablet 1,000 mg (1,000 mg Oral Given 10/23/22 1634)     IMPRESSION / MDM / ASSESSMENT AND PLAN / ED COURSE  I reviewed the  triage vital signs and the nursing notes.                              Differential diagnosis includes, but is not limited to, septic arthritis, gout arthritis, inflammatory arthritis, cellulitis, gonococcal arthritis.  Patient's presentation  is most consistent with acute presentation with potential threat to life or bodily function.  Patient is a 37 year old male presenting today for cellulitis to the left knee with pain concerning for possible septic arthritis.  Patient is febrile with an elevated white blood cell count.  He was given fevers and blood cultures were drawn along with ceftriaxone given.  Discussed case with orthopedics who did recommend tapping the knee.  I was only able to get 2 cc of yellowish fluid from the knee which lab was unable to process.  I discussed this with orthopedics who did not indicate further need for tap at this time or immediate IR consultation but would reevaluate again in the morning to discuss both of these.  Discussed case with hospitalist for admission who has agreed to admit patient for further care of known cellulitis and continued workup for possible septic left knee.  The patient is on the cardiac monitor to evaluate for evidence of arrhythmia and/or significant heart rate changes. Clinical Course as of 10/23/22 1704  Sat Oct 23, 2022  1444 WBC(!): 21.8 [DW]  1522 DG Knee 2 Views Left 1. Significant anterior knee soft tissue swelling. Small knee joint effusion.   [DW]  1522 Orthopedic agrees with knee arthrocentesis to evaluate for septic knee [DW]    Clinical Course User Index [DW] Janith Lima, MD     FINAL CLINICAL IMPRESSION(S) / ED DIAGNOSES   Final diagnoses:  Cellulitis of left knee  Sepsis, due to unspecified organism, unspecified whether acute organ dysfunction present [facility])     Rx / DC Orders   ED Discharge Orders     None        Note:  This document was prepared using Dragon voice recognition software and may  include unintentional dictation errors.   Janith Lima, MD 10/23/22 [phone]

## 2022-10-23 NOTE — ED Notes (Signed)
Patient is being discharged from the Urgent Care and sent to the [facility] Emergency Department via private vehicle . Per Doristine Mango, [place], patient is in need of higher level of care due to possible infected left knee joint. Patient is aware and verbalizes understanding of plan of care.  Vitals:   10/23/22 1338  BP: 125/81  Pulse: 90  Resp: 15  Temp: 98.9 F (37.2 C)  SpO2: 99%

## 2022-10-23 NOTE — ED Provider Notes (Addendum)
[facility]    CSN: [id number] Arrival date & time: 10/23/22  1220      History   Chief Complaint Chief Complaint  Patient presents with   Knee Pain    left    HPI Jason Mason is a 37 y.o. male.   37 year old male who presents to urgent care with progressively worsening right knee pain, swelling and erythema.  He reports that this started on Thursday and has worsened to a 10 out of 10 pain.  He has never had anything previous like this.  He denies any injury to the knee.  He is running a low-grade fever.  He has had chills.  He denies any other constitutional symptoms such as diarrhea shortness of breath, abdominal pain, headaches, congestion.   Knee Pain Associated symptoms: fatigue and fever   Associated symptoms: no back pain     History reviewed. No pertinent past medical history.  Patient Active Problem List   Diagnosis Date Noted   MDD (major depressive disorder), recurrent episode, mild (HCC) 07/31/2020    Past Surgical History:  Procedure Laterality Date   JOINT REPLACEMENT         Home Medications    Prior to Admission medications   Medication Sig Start Date End Date Taking? Authorizing Provider  ibuprofen (ADVIL,MOTRIN) 600 MG tablet Take 1 tablet (600 mg total) by mouth every 6 (six) hours as needed. Patient not taking: Reported on 07/12/2022 12/29/17   Enid Derry, PA-C  ondansetron (ZOFRAN-ODT) 4 MG disintegrating tablet Take 1 tablet (4 mg total) by mouth every 8 (eight) hours as needed for nausea or vomiting. Patient not taking: Reported on 07/12/2022 06/30/22   Marita Kansas, PA-C    Family History Family History  Problem Relation Age of Onset   Diabetes Mother    Diabetes Father    Hypertension Father    Cancer Maternal Grandmother        bone marrow cancer   Cancer Maternal Grandfather        bone marrow cancer    Social History Social History   Tobacco Use   Smoking status: Never   Smokeless tobacco: Never  Vaping  Use   Vaping status: Never Used  Substance Use Topics   Alcohol use: Yes    Alcohol/week: 0.0 standard drinks of alcohol   Drug use: No     Allergies   Patient has no known allergies.   Review of Systems Review of Systems  Constitutional:  Positive for activity change, chills, fatigue and fever.  HENT:  Negative for ear pain and sore throat.   Eyes:  Negative for pain and visual disturbance.  Respiratory:  Negative for cough and shortness of breath.   Cardiovascular:  Negative for chest pain and palpitations.  Gastrointestinal:  Negative for abdominal pain and vomiting.  Genitourinary:  Negative for dysuria and hematuria.  Musculoskeletal:  Positive for gait problem and joint swelling. Negative for arthralgias and back pain.  Skin:  Positive for color change. Negative for rash.  Neurological:  Negative for seizures and syncope.  All other systems reviewed and are negative.    Physical Exam Triage Vital Signs ED Triage Vitals  Encounter Vitals Group     BP 10/23/22 1338 125/81     Systolic BP Percentile --      Diastolic BP Percentile --      Pulse Rate 10/23/22 1338 90     Resp 10/23/22 1338 15     Temp 10/23/22 1338 98.9  F (37.2 C)     Temp Source 10/23/22 1338 Oral     SpO2 10/23/22 1338 99 %     Weight 10/23/22 1337 230 lb (104.3 kg)     Height 10/23/22 1337 5\' 8"  (1.727 m)     Head Circumference --      Peak Flow --      Pain Score 10/23/22 1336 10     Pain Loc --      Pain Education --      Exclude from Growth Chart --    No data found.  Updated Vital Signs BP 125/81 (BP Location: Right Arm)   Pulse 90   Temp 98.9 F (37.2 C) (Oral)   Resp 15   Ht 5\' 8"  (1.727 m)   Wt 230 lb (104.3 kg)   SpO2 99%   BMI 34.97 kg/m   Visual Acuity Right Eye Distance:   Left Eye Distance:   Bilateral Distance:    Right Eye Near:   Left Eye Near:    Bilateral Near:     Physical Exam Vitals and nursing note reviewed.  Constitutional:      General: He is  not in acute distress.    Appearance: He is well-developed.     Comments: Appears in pain  HENT:     Head: Normocephalic and atraumatic.  Eyes:     Conjunctiva/sclera: Conjunctivae normal.  Cardiovascular:     Rate and Rhythm: Normal rate and regular rhythm.     Heart sounds: No murmur heard. Pulmonary:     Effort: Pulmonary effort is normal. No respiratory distress.     Breath sounds: Normal breath sounds.  Abdominal:     Palpations: Abdomen is soft.     Tenderness: There is no abdominal tenderness.  Musculoskeletal:        General: Swelling and tenderness present.     Cervical back: Neck supple.     Left lower leg: Edema present.       Legs:  Skin:    General: Skin is warm and dry.     Capillary Refill: Capillary refill takes less than 2 seconds.  Neurological:     Mental Status: He is alert.  Psychiatric:        Mood and Affect: Mood normal.      UC Treatments / Results  Labs (all labs ordered are listed, but only abnormal results are displayed) Labs Reviewed - No data to display  EKG   Radiology No results found.  Procedures Procedures (including critical care time)  Medications Ordered in UC Medications - No data to display  Initial Impression / Assessment and Plan / UC Course  I have reviewed the triage vital signs and the nursing notes.  Pertinent labs & imaging results that were available during my care of the patient were reviewed by me and considered in my medical decision making (see chart for details).     Likely left knee septic joint: Discussed with the patient that this is likely a septic joint that will require possible admission to the hospital and imaging that we cannot provide here.  We recommend that the patient be seen at the emergency room.  The patient did drive himself and feels comfortable driving to the emergency room.  This is his left leg and he does not use this leg while driving.  I think it is reasonable that he would go by his  own private car.  His vital signs are stable. Final Clinical Impressions(s) /  UC Diagnoses   Final diagnoses:  None   Discharge Instructions   None    ED Prescriptions   None    PDMP not reviewed this encounter.   Landis Martins, PA-C 10/23/22 1349    Landis Martins, [place] 10/23/22 1354

## 2022-10-23 NOTE — Progress Notes (Signed)
Pharmacy Antibiotic Note  Jason Mason is a 37 y.o. male admitted on 10/23/2022 with cellulitis.  Pharmacy has been consulted for vancomycin dosing.  Plan: Vancomycin 2500 mg loading dose (2,000 mg followed by 500 mg to fulfill loading dose) Vancomycin 1,000 mg every 12 hours maintenance dose Goal AUC 400-550 Estimated AUC 448.1, Cmin 11.7  Weight: 104.3 kg (230 lb)  Temp (24hrs), Avg:100.6 F (38.1 C), Min:98.9 F (37.2 C), Max:103 F (39.4 C)  Recent Labs  Lab 10/23/22 1424 10/23/22 1529  WBC 21.8*  --   CREATININE 1.01  --   LATICACIDVEN 1.6 1.4    Estimated Creatinine Clearance: 118.4 mL/min (by C-G formula based on SCr of 1.01 mg/dL).    No Known Allergies  Antimicrobials this admission: vancomycin 8/17 >>  ceftriaxone 8/17 >>   Dose adjustments this admission: N/a  Microbiology results: 8/17 body fluid from knee: in process 8/17 Sputum: in process  8/17 Bcx: in process  Thank you for allowing pharmacy to be a part of this patient's care.  Jaynie Bream 10/23/2022 5:55 PM

## 2022-10-23 NOTE — ED Triage Notes (Addendum)
Pt from urgent care for left knee pain since Thursday and possible septic  joint. Left knee is visibly swollen, red, tender. Pt denies injury.

## 2022-10-23 NOTE — ED Triage Notes (Signed)
Patient c/o left knee redness, swelling and pain that started on Thursday.  Patient states that it has gotten worse over the past 2 days.  Patient denies injury or fall.

## 2022-10-23 NOTE — H&P (Addendum)
History and Physical    Jason Mason UUV:[id number] DOB: 09-Apr-1985 DOA: 10/23/2022  PCP: [facility]  Patient coming from: home  I have personally briefly reviewed patient's old medical records in [facility]  Chief Complaint: left knee pain swelling and redness x 2-3 days  HPI: Jason Mason is a 37 y.o. male with no significant medical history who presents to ED with close to three days of progressive left knee pain associated with redness and swelling as well as low grade fever and chills. Patient noted no prior episode like this in the past. Patient also denies any trauma or insect bite. 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 He noted no recent cough / or uri sxs. He denies any urinary symptoms or dysuria. He does note increase thirst.   ED Course:  Tmx 100.6-103 BP 125/81, hr 90 , rr 15, sat 99% RA Wbc 21.8, hgb 15.0, plt 210 Na 132, K 3.8, cl100, glu 132, cr 1.01,  Lactic 1.6 Uric Acid 4.8 ESR 20 Knee xray  IMPRESSION: 1. Significant anterior knee soft tissue swelling. Small knee joint effusion. 2. No acute osseous abnormality.  Tx morphine  4mg  , tylenol 1000mg ,ceftriaxone  Review of Systems: As per HPI otherwise 10 point review of systems negative.   History reviewed. No pertinent past medical history.  PSH ACL repair on right   reports that he has never smoked. He has never used smokeless tobacco. He reports current alcohol use. He reports that he does not use drugs.  No Known Allergies  Family History  Problem Relation Age of Onset   Diabetes Mother    Diabetes Father    Hypertension Father    Cancer Maternal Grandmother        bone marrow cancer   Cancer Maternal Grandfather        bone marrow cancer    Prior to Admission medications   Medication Sig Start Date End Date Taking? Authorizing Provider  ibuprofen (ADVIL,MOTRIN) 600 MG tablet Take 1 tablet (600 mg total) by mouth every 6  (six) hours as needed. Patient not taking: Reported on 07/12/2022 12/29/17   Enid Derry, PA-C  ondansetron (ZOFRAN-ODT) 4 MG disintegrating tablet Take 1 tablet (4 mg total) by mouth every 8 (eight) hours as needed for nausea or vomiting. Patient not taking: Reported on 07/12/2022 06/30/22   Marita Kansas, PA-C    Physical Exam: Vitals:   10/23/22 1420 10/23/22 1421 10/23/22 1634 10/23/22 1645  BP: 138/84   119/67  Pulse: 94   94  Resp: 17     Temp: (!) 100.6 F (38.1 C)  (!) 103 F (39.4 C)   TempSrc: Oral  Oral   SpO2: 100%   99%  Weight:  104.3 kg      Constitutional: NAD, calm, comfortable Vitals:   10/23/22 1420 10/23/22 1421 10/23/22 1634 10/23/22 1645  BP: 138/84   119/67  Pulse: 94   94  Resp: 17     Temp: (!) 100.6 F (38.1 C)  (!) 103 F (39.4 C)   TempSrc: Oral  Oral   SpO2: 100%   99%  Weight:  104.3 kg     Eyes: PERRL, lids and conjunctivae normal ENMT: Mucous membranes are moist. Posterior pharynx clear of any exudate or lesions.Normal dentition.  Neck: normal, supple, no masses, no thyromegaly Respiratory: clear to auscultation bilaterally, no wheezing, no crackles. Normal respiratory effort. No accessory muscle use.  Cardiovascular: Regular rate and rhythm, no murmurs / rubs / gallops. No extremity edema. 2+ pedal pulses.  Abdomen: no tenderness, no masses palpated. No hepatosplenomegaly. Bowel sounds positive.  Musculoskeletal: no clubbing / cyanosis. Swollen tender right prepatellar bursa, joint line minimally tender, patient able to bend knee no contractures. Normal muscle tone.  Skin: expanding erythema just above ankle and up mid way lateral thigh Neurologic: CN 2-12 grossly intact. Sensation intact, l. Strength 5/5 in all 4.  Psychiatric: Normal judgment and insight. Alert and oriented x 3. Normal mood.    Labs on Admission: I have personally reviewed following labs and imaging studies  CBC: Recent Labs  Lab 10/23/22 1424  WBC 21.8*  NEUTROABS  19.3*  HGB 15.0  HCT 46.1  MCV 83.5  PLT 210   Basic Metabolic Panel: Recent Labs  Lab 10/23/22 1424  NA 132*  K 3.8  CL 100  CO2 23  GLUCOSE 132*  BUN 10  CREATININE 1.01  CALCIUM 9.0   GFR: Estimated Creatinine Clearance: 118.4 mL/min (by C-G formula based on SCr of 1.01 mg/dL). Liver Function Tests: Recent Labs  Lab 10/23/22 1424  AST 18  ALT 25  ALKPHOS 54  BILITOT 1.7*  PROT 8.3*  ALBUMIN 4.5   No results for input(s): "LIPASE", "AMYLASE" in the last 168 hours. No results for input(s): "AMMONIA" in the last 168 hours. Coagulation Profile: No results for input(s): "INR", "PROTIME" in the last 168 hours. Cardiac Enzymes: No results for input(s): "CKTOTAL", "CKMB", "CKMBINDEX", "TROPONINI" in the last 168 hours. BNP (last 3 results) No results for input(s): "PROBNP" in the last 8760 hours. HbA1C: No results for input(s): "HGBA1C" in the last 72 hours. CBG: No results for input(s): "GLUCAP" in the last 168 hours. Lipid Profile: No results for input(s): "CHOL", "HDL", "LDLCALC", "TRIG", "CHOLHDL", "LDLDIRECT" in the last 72 hours. Thyroid Function Tests: No results for input(s): "TSH", "T4TOTAL", "FREET4", "T3FREE", "THYROIDAB" in the last 72 hours. Anemia Panel: No results for input(s): "VITAMINB12", "FOLATE", "FERRITIN", "TIBC", "IRON", "RETICCTPCT" in the last 72 hours. Urine analysis: No results found for: "COLORURINE", "APPEARANCEUR", "LABSPEC", "PHURINE", "GLUCOSEU", "HGBUR", "BILIRUBINUR", "KETONESUR", "PROTEINUR", "UROBILINOGEN", "NITRITE", "LEUKOCYTESUR"  Radiological Exams on Admission: DG Knee 2 Views Left  Result Date: 10/23/2022 CLINICAL DATA:  Swelling.  Possible septic joint.  No injury. EXAM: LEFT KNEE - 1-2 VIEW COMPARISON:  None Available. FINDINGS: There is significant anterior knee soft tissue swelling. There is evidence of old Osgood-Schlatter's disease. There is no acute fracture. Joint spaces are well maintained. No cortical erosions are  identified. Small knee joint effusion. IMPRESSION: 1. Significant anterior knee soft tissue swelling. Small knee joint effusion. 2. No acute osseous abnormality. Electronically Signed   By: Darliss Cheney M.D.   On: 10/23/2022 15:18    EKG: Independently reviewed.   Assessment/Plan   Left lower extremity Cellulitis  ?Left septic Prepatellar Bursitis r/o extension to the left knee joint -decrease rom due to pain, unable to bear weight  -fever with wbc 22  - case discussed with DR Hyacinth Meeker will see in am  -CT pending  -continue with CTX/vanc  -f/u blood cultures  -s/p arthrocentesis , essentially dry tap, will need ortho vs ir tap in am  -supportive care with RICE / pain medication -to be complete GC urine sent  -due to significant swell will r/o dvt as well u/s pending     DVT prophylaxis: heparin Code Status: full/ as discussed per patient wishes in event of cardiac arrest  Family Communication:  Izaih, Jaggard (Mother) [phone] [facility] Phone)   Disposition Plan: patient  expected to be admitted greater than 2 midnights  Consults called: Orthopedics Dr Hyacinth Meeker Admission status: med tele   Lurline Del MD [facility]  If 7PM-7AM, please contact night-coverage [email or web address] Password [facility]  10/23/2022, 4:59 PM

## 2022-10-23 NOTE — ED Notes (Signed)
Synovial fluid from left knee was sent to lab. Amount was insufficient quantity to test. Dr. Well is aware.

## 2022-10-24 DIAGNOSIS — L03116 Cellulitis of left lower limb: Secondary | ICD-10-CM

## 2022-10-24 LAB — CBC
HCT: 38 % — ABNORMAL LOW (ref 39.0–52.0)
Hemoglobin: 13.1 g/dL (ref 13.0–17.0)
MCH: 27.6 pg (ref 26.0–34.0)
MCHC: 34.5 g/dL (ref 30.0–36.0)
MCV: 80 fL (ref 80.0–100.0)
Platelets: 182 10*3/uL (ref 150–400)
RBC: 4.75 MIL/uL (ref 4.22–5.81)
RDW: 11.9 % (ref 11.5–15.5)
WBC: 18.3 10*3/uL — ABNORMAL HIGH (ref 4.0–10.5)
nRBC: 0 % (ref 0.0–0.2)

## 2022-10-24 LAB — COMPREHENSIVE METABOLIC PANEL
ALT: 22 U/L (ref 0–44)
AST: 16 U/L (ref 15–41)
Albumin: 3.4 g/dL — ABNORMAL LOW (ref 3.5–5.0)
Alkaline Phosphatase: 49 U/L (ref 38–126)
Anion gap: 10 (ref 5–15)
BUN: 10 mg/dL (ref 6–20)
CO2: 21 mmol/L — ABNORMAL LOW (ref 22–32)
Calcium: 8.6 mg/dL — ABNORMAL LOW (ref 8.9–10.3)
Chloride: 104 mmol/L (ref 98–111)
Creatinine, Ser: 0.93 mg/dL (ref 0.61–1.24)
GFR, Estimated: >60 mL/min (ref >60)
Glucose, Bld: 115 mg/dL — ABNORMAL HIGH (ref 70–99)
Potassium: 3.9 mmol/L (ref 3.5–5.1)
Sodium: 135 mmol/L (ref 135–145)
Total Bilirubin: 1.3 mg/dL — ABNORMAL HIGH (ref 0.3–1.2)
Total Protein: 7.1 g/dL (ref 6.5–8.1)

## 2022-10-24 LAB — HIV ANTIBODY (ROUTINE TESTING W REFLEX): HIV Screen 4th Generation wRfx: NONREACTIVE

## 2022-10-24 LAB — PROTIME-INR
INR: 1.2 (ref 0.8–1.2)
Prothrombin Time: 15.2 s (ref 11.4–15.2)

## 2022-10-24 LAB — C-REACTIVE PROTEIN: CRP: 28.6 mg/dL — ABNORMAL HIGH (ref <1.0)

## 2022-10-24 MED ORDER — ENOXAPARIN SODIUM 60 MG/0.6ML IJ SOSY
50.0000 mg | PREFILLED_SYRINGE | INTRAMUSCULAR | Status: DC
  Administered 2022-10-24 – 2022-10-28 (×5): 50 mg via SUBCUTANEOUS
  Filled 2022-10-24 (×5): qty 0.6

## 2022-10-24 MED ORDER — ENOXAPARIN SODIUM 40 MG/0.4ML IJ SOSY
40.0000 mg | PREFILLED_SYRINGE | INTRAMUSCULAR | Status: DC
Start: 2022-10-24 — End: 2022-10-24

## 2022-10-24 MED ORDER — VANCOMYCIN HCL 1250 MG/250ML IV SOLN
1250.0000 mg | Freq: Two times a day (BID) | INTRAVENOUS | Status: DC
  Administered 2022-10-24 – 2022-10-25 (×2): 1250 mg via INTRAVENOUS
  Filled 2022-10-24 (×2): qty 250

## 2022-10-24 NOTE — Progress Notes (Signed)
  PROGRESS NOTE    Jason Mason  ZOX:[id number] DOB: Sep 27, 1985 DOA: 10/23/2022 PCP: [facility]  [id number]/[id number]-AA  LOS: 1 day   Brief hospital course:   Assessment & Plan: Jason Mason is a 38 y.o. male with no significant medical history who presents to ED with close to three days of progressive left knee pain associated with redness and swelling as well as low grade fever and chills. Patient noted no prior episode like this in the past. Patient also denies any trauma or insect bite. 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    DVT prophylaxis: Lovenox SQ Code Status: Full code  Family Communication: mother updated at bedside today Level of care: Telemetry Medical Dispo:   The patient is from: home Anticipated d/c is to: home Anticipated d/c date is: >3 days   Subjective and Interval History:  Pt reported left knee pain improved.   Objective: Vitals:   10/23/22 1802 10/23/22 2318 10/24/22 0742 10/24/22 1617  BP:  121/67 (!) 126/58 (!) 111/50  Pulse:  (!) 105 (!) 102 (!) 104  Resp:  20 18 18   Temp:  (!) 102.4 F (39.1 C) (!) 101 F (38.3 C) (!) 101.9 F (38.8 C)  TempSrc: Oral     SpO2:  99% 98% 99%  Weight: 104.3 kg     Height: 5\' 8"  (1.727 m)       Intake/Output Summary (Last 24 hours) at 10/24/2022 1639 Last data filed at 10/24/2022 1602 Gross per 24 hour  Intake 2220.03 ml  Output 1650 ml  Net 570.03 ml   Filed Weights   10/23/22 1421 10/23/22 1802  Weight: 104.3 kg 104.3 kg    Examination:   Constitutional: NAD, AAOx3 HEENT: conjunctivae and lids normal, EOMI CV: No cyanosis.   RESP: normal respiratory  effort, on RA Extremities: ACE wrap over left knee, erythema and edema above and below left knee SKIN: warm, dry Neuro: II - XII grossly intact.   Psych: Normal mood and affect.  Appropriate judgement and reason   Data Reviewed: I have personally reviewed labs and imaging studies  Time spent: 35 minutes  Darlin Priestly, MD [facility] If 7PM-7AM, please contact night-coverage 10/24/2022, 4:39 PM

## 2022-10-24 NOTE — Progress Notes (Addendum)
Pharmacy Antibiotic Note  Jason Mason is a 37 y.o. male admitted on 10/23/2022 with cellulitis.  Pharmacy has been consulted for vancomycin dosing.  Plan: Pt received vancomycin 2500 mg loading dose and currently on vancomycin 1000 mg q12H. Will increase dose to 1250 mg q12H. Pt is young with good renal function. Predicted AUC 517. Goal AUC 400-600. Scr 0.93, Vd 0.5, IBW. Plan to obtain level after 4th or 5th dose.   Continue ceftriaxone 2 g daily.   Height: 5\' 8"  (172.7 cm) Weight: 104.3 kg (230 lb) IBW/kg (Calculated) : 68.4  Temp (24hrs), Avg:100.9 F (38.3 C), Min:98.9 F (37.2 C), Max:103 F (39.4 C)  Recent Labs  Lab 10/23/22 1424 10/23/22 1529 10/23/22 1805 10/24/22 0446  WBC 21.8*  --   --  18.3*  CREATININE 1.01  --   --  0.93  LATICACIDVEN 1.6 1.4 1.0  --     Estimated Creatinine Clearance: 128.6 mL/min (by C-G formula based on SCr of 0.93 mg/dL).    No Known Allergies  Antimicrobials this admission: vancomycin 8/17 >>  ceftriaxone 8/17 >>   Dose adjustments this admission: N/a  Microbiology results: 8/17 body fluid from knee: in process 8/17 Sputum: in process  8/17 Bcx: in process  Thank you for allowing pharmacy to be a part of this patient's care.  Ronnald Ramp, PharmD, BCPS 10/24/2022 9:25 AM

## 2022-10-24 NOTE — Progress Notes (Signed)
PHARMACIST - PHYSICIAN COMMUNICATION  CONCERNING:  Enoxaparin (Lovenox) for DVT Prophylaxis    RECOMMENDATION: Patient was prescribed enoxaprin 40mg  q24 hours for VTE prophylaxis.   Filed Weights   10/23/22 1421 10/23/22 1802  Weight: 104.3 kg (230 lb) 104.3 kg (230 lb)    Body mass index is 34.97 kg/m.  Estimated Creatinine Clearance: 128.6 mL/min (by C-G formula based on SCr of 0.93 mg/dL).   Based on [facility] policy patient is candidate for enoxaparin 0.5mg /kg TBW SQ every 24 hours based on BMI being >30.  DESCRIPTION: Pharmacy has adjusted enoxaparin dose per [facility] policy.  Patient is now receiving enoxaparin 0.5 mg/kg every 24 hours   Otelia Sergeant, PharmD, [facility] 10/24/2022 4:54 PM

## 2022-10-24 NOTE — Plan of Care (Signed)

## 2022-10-24 NOTE — Plan of Care (Signed)

## 2022-10-24 NOTE — Progress Notes (Signed)
Subjective:   The patient says he slept through the night but is still having a fair amount of leg pain.  Increased blood flow to the leg hurt when he got up to go to the bathroom.  White count is down to 18,000 today.  Temperature has been about 101.  Pain is a little better.  MRI showed cellulitis and small abscess at the patella bursa region.  The joint did not have any obvious infection.    Patient reports pain as moderate.  Objective:   VITALS:   Vitals:   10/23/22 2318 10/24/22 0742  BP: 121/67 (!) 126/58  Pulse: (!) 105 (!) 102  Resp: 20 18  Temp: (!) 102.4 F (39.1 C) (!) 101 F (38.3 C)  SpO2: 99% 98%    Neurologically intact Neurovascular intact Sensation intact distally Dorsiflexion/Plantar flexion intact  LABS Recent Labs    10/23/22 1424 10/24/22 0446  HGB 15.0 13.1  HCT 46.1 38.0*  WBC 21.8* 18.3*  PLT 210 182    Recent Labs    10/23/22 1424 10/24/22 0446  NA 132* 135  K 3.8 3.9  BUN 10 10  CREATININE 1.01 0.93  GLUCOSE 132* 115*    Recent Labs    10/24/22 0446  INR 1.2     Assessment/Plan:    Cellulitis left leg with a small superficial abscess. Continue aggressive IV antibiotics. I may need to do a bedside I&D in a day or 2 if bursal swelling persist.

## 2022-10-25 DIAGNOSIS — A419 Sepsis, unspecified organism: Secondary | ICD-10-CM

## 2022-10-25 DIAGNOSIS — L0390 Cellulitis, unspecified: Secondary | ICD-10-CM | POA: Diagnosis not present

## 2022-10-25 LAB — CBC
HCT: 37.8 % — ABNORMAL LOW (ref 39.0–52.0)
Hemoglobin: 12.9 g/dL — ABNORMAL LOW (ref 13.0–17.0)
MCH: 27.9 pg (ref 26.0–34.0)
MCHC: 34.1 g/dL (ref 30.0–36.0)
MCV: 81.8 fL (ref 80.0–100.0)
Platelets: 220 10*3/uL (ref 150–400)
RBC: 4.62 MIL/uL (ref 4.22–5.81)
RDW: 11.9 % (ref 11.5–15.5)
WBC: 14.4 10*3/uL — ABNORMAL HIGH (ref 4.0–10.5)
nRBC: 0 % (ref 0.0–0.2)

## 2022-10-25 LAB — BASIC METABOLIC PANEL
Anion gap: 6 (ref 5–15)
BUN: 8 mg/dL (ref 6–20)
CO2: 26 mmol/L (ref 22–32)
Calcium: 8.3 mg/dL — ABNORMAL LOW (ref 8.9–10.3)
Chloride: 103 mmol/L (ref 98–111)
Creatinine, Ser: 0.96 mg/dL (ref 0.61–1.24)
GFR, Estimated: >60 mL/min (ref >60)
Glucose, Bld: 110 mg/dL — ABNORMAL HIGH (ref 70–99)
Potassium: 3.7 mmol/L (ref 3.5–5.1)
Sodium: 135 mmol/L (ref 135–145)

## 2022-10-25 LAB — MAGNESIUM: Magnesium: 2.1 mg/dL (ref 1.7–2.4)

## 2022-10-25 LAB — LYME DISEASE SEROLOGY W/REFLEX: Lyme Total Antibody EIA: NEGATIVE

## 2022-10-25 MED ORDER — VANCOMYCIN HCL 1500 MG/300ML IV SOLN
1500.0000 mg | Freq: Two times a day (BID) | INTRAVENOUS | Status: DC
  Administered 2022-10-25 – 2022-10-26 (×2): 1500 mg via INTRAVENOUS
  Filled 2022-10-25 (×3): qty 300

## 2022-10-25 NOTE — Progress Notes (Signed)
Pharmacy Antibiotic Note  Jason Mason is a 37 y.o. male admitted on 10/23/2022 with cellulitis.  Pharmacy has been consulted for vancomycin dosing.  Plan: Pt received vancomycin 2500 mg loading dose and currently on vancomycin 1250 mg q12H. Will increase dose to 1500 mg q12H. Pt is young with good renal function. Predicted AUC 444.8. Goal AUC 400-600. Scr 0.96, Vd 0.72, IBW. Level ordered for tomorrow with AM labs, after 4th maintenance dose.   Continue ceftriaxone 2 g daily.   Height: 5\' 8"  (172.7 cm) Weight: 104.3 kg (230 lb) IBW/kg (Calculated) : 68.4  Temp (24hrs), Avg:100 F (37.8 C), Min:98.2 F (36.8 C), Max:101.9 F (38.8 C)  Recent Labs  Lab 10/23/22 1424 10/23/22 1529 10/23/22 1805 10/24/22 0446 10/25/22 0445  WBC 21.8*  --   --  18.3* 14.4*  CREATININE 1.01  --   --  0.93 0.96  LATICACIDVEN 1.6 1.4 1.0  --   --     Estimated Creatinine Clearance: 124.6 mL/min (by C-G formula based on SCr of 0.96 mg/dL).    No Known Allergies  Antimicrobials this admission: vancomycin 8/17 >>  ceftriaxone 8/17 >>   Dose adjustments this admission: N/a  Microbiology results: 8/17 body fluid from knee: NGTD 8/17 Bcx: NGTD  Thank you for allowing pharmacy to be a part of this patient's care.  Merryl Hacker, PharmD 10/25/2022 8:47 AM

## 2022-10-25 NOTE — Progress Notes (Signed)
Subjective:   Patient is gradually improving.  His pain is lessened.  He is responding to antibiotics with a lowered white count.  Temperature 99.1 degrees.  The cellulitis is lessened since yesterday.  Ambulation causes pain as blood rushes to the leg.  He can go to the bathroom but does not need to do more than that.    Patient reports pain as moderate.  Objective:   VITALS:   Vitals:   10/25/22 0505 10/25/22 0826  BP:  126/74  Pulse:  99  Resp:  16  Temp: 99.1 F (37.3 C)   SpO2:  98%    Neurologically intact Intact pulses distally Dorsiflexion/Plantar flexion intact  LABS Recent Labs    10/23/22 1424 10/24/22 0446 10/25/22 0445  HGB 15.0 13.1 12.9*  HCT 46.1 38.0* 37.8*  WBC 21.8* 18.3* 14.4*  PLT 210 182 220    Recent Labs    10/23/22 1424 10/24/22 0446 10/25/22 0445  NA 132* 135 135  K 3.8 3.9 3.7  BUN 10 10 8   CREATININE 1.01 0.93 0.96  GLUCOSE 132* 115* 110*    Recent Labs    10/24/22 0446  INR 1.2     Assessment/Plan:    Continue IV antibiotic therapy Will reassess tomorrow to see if bedside I&D is necessary over the bursa.

## 2022-10-25 NOTE — Plan of Care (Signed)
  Problem: Education: Goal: Knowledge of General Education information will improve Description: Including pain rating scale, medication(s)/side effects and non-pharmacologic comfort measures Outcome: Progressing   Problem: Health Behavior/Discharge Planning: Goal: Ability to manage health-related needs will improve Outcome: Progressing   Problem: Clinical Measurements: Goal: Ability to maintain clinical measurements within normal limits will improve Outcome: Progressing   Problem: Activity: Goal: Risk for activity intolerance will decrease Outcome: Progressing   Problem: Coping: Goal: Level of anxiety will decrease Outcome: Progressing   Problem: Elimination: Goal: Will not experience complications related to bowel motility Outcome: Progressing   

## 2022-10-25 NOTE — TOC CM/SW Note (Signed)
Transition of Care [facility]) - Inpatient Brief Assessment   Patient Details  Name: Jason Mason MRN: [id number] Date of Birth: 1985-12-05  Transition of Care [facility]) CM/SW Contact:    Margarito Liner, LCSW Phone Number: 10/25/2022, 2:28 PM   Clinical Narrative: CSW reviewed chart. No TOC needs identified. CSW will continue to follow progress. Please place [facility] consult if any needs arise.  Transition of Care Asessment: Insurance and Status: Insurance coverage has been reviewed Patient has primary care physician: Yes Home environment has been reviewed: Single family home Prior level of function:: Not documented Prior/Current Home Services: No current home services Social Determinants of Health Reivew: SDOH reviewed no interventions necessary Readmission risk has been reviewed: Yes Transition of care needs: no transition of care needs at this time

## 2022-10-25 NOTE — Evaluation (Signed)
Physical Therapy Evaluation Patient Details Name: Jason Mason MRN: [id number] DOB: 28-Jan-1986 Today's Date: 10/25/2022  History of Present Illness  Pt is a 37 y/o M admitted on 10/23/22 after presenting with c/o 3 days progressive L knee pain associated with redness, swelling, low grade fever, & chills. Pt is being treated for LLE cellulitis & small abscess at the patella bursa region. PMH: none  Clinical Impression  Pt seen for PT evaluation with pt agreeable to tx, mother present in room. Prior to admission pt was independent without AD, driving, working. On this date, pt is limited by L knee pain at rest that increases with dependent position & mobility. Pt is able to ambulate into hallway with crutches & supervision<>CGA while maintaining NWB LLE, despite PT educating him on WBAT LLE. PT encouraged gentle L knee AROM as pt can tolerate, as well as lying with LLE in neutral position. Will continue to follow pt acutely to progress gait with LRAD & for stair training.         If plan is discharge home, recommend the following: A little help with walking and/or transfers;A little help with bathing/dressing/bathroom;Assistance with cooking/housework;Help with stairs or ramp for entrance;Assist for transportation   Can travel by private vehicle        Equipment Recommendations Crutches  Recommendations for Other Services       Functional Status Assessment Patient has had a recent decline in their functional status and demonstrates the ability to make significant improvements in function in a reasonable and predictable amount of time.     Precautions / Restrictions Precautions Precautions: Fall Restrictions Weight Bearing Restrictions: Yes LLE Weight Bearing: Weight bearing as tolerated      Mobility  Bed Mobility Overal bed mobility: Modified Independent Bed Mobility: Supine to Sit, Sit to Supine     Supine to sit: Modified independent (Device/Increase time), HOB elevated,  Used rails Sit to supine: Modified independent (Device/Increase time), HOB elevated, Used rails        Transfers Overall transfer level: Needs assistance Equipment used: Crutches Transfers: Sit to/from Stand Sit to Stand: Supervision           General transfer comment: STS from EOB with crutches    Ambulation/Gait Ambulation/Gait assistance: Contact guard assist, Supervision Gait Distance (Feet): 80 Feet Assistive device: Crutches Gait Pattern/deviations: Step-to pattern, Decreased stride length Gait velocity: decreased     General Gait Details: occasional light CGA, education re: proper use of crutches, step length  Stairs            Wheelchair Mobility     Tilt Bed    Modified Rankin (Stroke Patients Only)       Balance     Sitting balance-Leahy Scale: Good     Standing balance support: During functional activity, Bilateral upper extremity supported Standing balance-Leahy Scale: Fair                               Pertinent Vitals/Pain Pain Assessment Pain Assessment: 0-10 Pain Score:  (4/10 at rest at beginning of session, increases to 7/10 with mobility) Pain Location: L knee Pain Descriptors / Indicators: Aching, Discomfort, Grimacing, Guarding Pain Intervention(s): Monitored during session, Limited activity within patient's tolerance, Repositioned    Home Living Family/patient expects to be discharged to:: Private residence Living Arrangements: Parent Available Help at Discharge: Family;Available 24 hours/day Type of Home: House Home Access: Stairs to enter Entrance Stairs-Rails: Right;Left;Can reach both Entrance  Stairs-Number of Steps: 4   Home Layout: One level Home Equipment: None      Prior Function Prior Level of Function : Independent/Modified Independent;Working/employed;Driving                     Extremity/Trunk Assessment   Upper Extremity Assessment Upper Extremity Assessment: Overall WFL for  tasks assessed    Lower Extremity Assessment Lower Extremity Assessment: LLE deficits/detail LLE Deficits / Details: Pt with minimal knee AROM 2/2 pain, unable to weight bear 2/2 pain, increased pain with dependent position    Cervical / Trunk Assessment Cervical / Trunk Assessment: Normal  Communication      Cognition Arousal: Alert Behavior During Therapy: WFL for tasks assessed/performed Overall Cognitive Status: Within Functional Limits for tasks assessed                                          General Comments      Exercises     Assessment/Plan    PT Assessment Patient needs continued PT services  PT Problem List Pain;Decreased range of motion;Decreased activity tolerance;Decreased balance;Decreased mobility;Decreased knowledge of precautions;Decreased safety awareness;Decreased knowledge of use of DME       PT Treatment Interventions DME instruction;Balance training;Gait training;Neuromuscular re-education;Stair training;Functional mobility training;Patient/family education;Therapeutic activities;Therapeutic exercise;Manual techniques;Modalities    PT Goals (Current goals can be found in the Care Plan section)  Acute Rehab PT Goals Patient Stated Goal: decreased pain PT Goal Formulation: With patient Time For Goal Achievement: 11/08/22 Potential to Achieve Goals: Good    Frequency Min 1X/week     Co-evaluation               AM-PAC PT "6 Clicks" Mobility  Outcome Measure Help needed turning from your back to your side while in a flat bed without using bedrails?: None Help needed moving from lying on your back to sitting on the side of a flat bed without using bedrails?: A Little Help needed moving to and from a bed to a chair (including a wheelchair)?: A Little Help needed standing up from a chair using your arms (e.g., wheelchair or bedside chair)?: A Little Help needed to walk in hospital room?: A Little Help needed climbing 3-5  steps with a railing? : A Little 6 Click Score: 19    End of Session   Activity Tolerance: Patient limited by pain Patient left: in bed;with call bell/phone within reach (LLE elevated on pillow) Nurse Communication: Mobility status PT Visit Diagnosis: Other abnormalities of gait and mobility (R26.89);Pain;Difficulty in walking, not elsewhere classified (R26.2) Pain - Right/Left: Left Pain - part of body: Knee    Time: 1610-9604 PT Time Calculation (min) (ACUTE ONLY): 14 min   Charges:   PT Evaluation $PT Eval Low Complexity: 1 Low   PT General Charges $$ ACUTE PT VISIT: 1 Visit         Aleda Grana, PT, DPT 10/25/22, 3:16 PM   Sandi Mariscal 10/25/2022, 3:14 PM

## 2022-10-25 NOTE — Progress Notes (Signed)
  PROGRESS NOTE    Jason Mason  ZOX:[id number] DOB: 1986-01-24 DOA: 10/23/2022 PCP: [facility]  158A/158A-AA  LOS: 2 days   Brief hospital course:   Assessment & Plan: Jason Mason is a 37 y.o. male with no significant medical history who presents to ED with close to three days of progressive left knee pain associated with redness and swelling as well as low grade fever and chills. Patient noted no prior episode like this in the past. Patient also denies any trauma or insect bite. 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  --can weight bear as tolerated   DVT prophylaxis: Lovenox SQ Code Status: Full code  Family Communication: mother updated at bedside today Level of care: Telemetry Medical Dispo:   The patient is from: home Anticipated d/c is to: home Anticipated d/c date is: >3 days   Subjective and Interval History:  Jason Mason reported knee pain continued to improve.  Asking to get up and move around.   Objective: Vitals:   10/25/22 0145 10/25/22 0505 10/25/22 0826 10/25/22 1509  BP:   126/74 113/67  Pulse:   99 (!) 102  Resp:   16 14  Temp: 99.8 F (37.7 C) 99.1 F (37.3 C)  100.2 F (37.9 C)  TempSrc:  Oral    SpO2:   98% 97%  Weight:      Height:        Intake/Output Summary (Last 24 hours) at 10/25/2022 1808 Last data filed at 10/25/2022 1500 Gross per 24 hour  Intake 2170 ml  Output 4750 ml  Net -2580 ml   Filed Weights   10/23/22 1421 10/23/22 1802  Weight: 104.3 kg 104.3 kg    Examination:   Constitutional: NAD, AAOx3 HEENT: conjunctivae and lids normal, EOMI CV: No cyanosis.   RESP: normal respiratory  effort, on RA Extremities: ACE wrap over left knee.  Erythema and edema above and below the knee improved. SKIN: warm, dry Neuro: II - XII grossly intact.   Psych: Normal mood and affect.  Appropriate judgement and reason   Data Reviewed: I have personally reviewed labs and imaging studies  Time spent: 25 minutes  Darlin Priestly, MD [facility] If 7PM-7AM, please contact night-coverage 10/25/2022, 6:08 PM

## 2022-10-26 DIAGNOSIS — A419 Sepsis, unspecified organism: Secondary | ICD-10-CM | POA: Diagnosis not present

## 2022-10-26 DIAGNOSIS — L0390 Cellulitis, unspecified: Secondary | ICD-10-CM | POA: Diagnosis not present

## 2022-10-26 LAB — CBC
HCT: 38.7 % — ABNORMAL LOW (ref 39.0–52.0)
Hemoglobin: 13 g/dL (ref 13.0–17.0)
MCH: 27.5 pg (ref 26.0–34.0)
MCHC: 33.6 g/dL (ref 30.0–36.0)
MCV: 81.8 fL (ref 80.0–100.0)
Platelets: 239 10*3/uL (ref 150–400)
RBC: 4.73 MIL/uL (ref 4.22–5.81)
RDW: 11.8 % (ref 11.5–15.5)
WBC: 12.7 10*3/uL — ABNORMAL HIGH (ref 4.0–10.5)
nRBC: 0 % (ref 0.0–0.2)

## 2022-10-26 LAB — BASIC METABOLIC PANEL
Anion gap: 9 (ref 5–15)
BUN: 8 mg/dL (ref 6–20)
CO2: 24 mmol/L (ref 22–32)
Calcium: 8.5 mg/dL — ABNORMAL LOW (ref 8.9–10.3)
Chloride: 101 mmol/L (ref 98–111)
Creatinine, Ser: 0.96 mg/dL (ref 0.61–1.24)
GFR, Estimated: >60 mL/min (ref >60)
Glucose, Bld: 110 mg/dL — ABNORMAL HIGH (ref 70–99)
Potassium: 3.8 mmol/L (ref 3.5–5.1)
Sodium: 134 mmol/L — ABNORMAL LOW (ref 135–145)

## 2022-10-26 LAB — VANCOMYCIN, RANDOM: Vancomycin Rm: 7 ug/mL

## 2022-10-26 LAB — MAGNESIUM: Magnesium: 2.4 mg/dL (ref 1.7–2.4)

## 2022-10-26 MED ORDER — VANCOMYCIN HCL IN DEXTROSE 1-5 GM/200ML-% IV SOLN
1000.0000 mg | Freq: Three times a day (TID) | INTRAVENOUS | Status: DC
  Administered 2022-10-26 – 2022-10-27 (×3): 1000 mg via INTRAVENOUS
  Filled 2022-10-26 (×3): qty 200

## 2022-10-26 NOTE — Progress Notes (Addendum)
Physical Therapy Treatment Patient Details Name: Jason Mason MRN: [id number] DOB: 21-Apr-1985 Today's Date: 10/26/2022   History of Present Illness Pt is a 38 y/o M admitted on 10/23/22 after presenting with c/o 3 days progressive L knee pain associated with redness, swelling, low grade fever, & chills. Pt is being treated for LLE cellulitis & small abscess at the patella bursa region. PMH: none    PT Comments  Pt presents laying in bed with mother in room, pain in supine 4/10. Pt motivated to participate in therapy but hesitant due to pain. Pt able to perform supine<>sit modi, unable to place LLE on floor for more than a few seconds due to throbbing pain. PT educated on deep breathing techniques to assist with managing initial pain with gravity dependent positions. Pt attempted sit<>stand with crutches and CGA, but only able to maintain standing for ~5min due to pain (8/10) and had to lay down for it to subside. RN notified upon entry that pt requested pain meds. Pt left with RN/MD in room. Care team updated of pt needs for continued skilled PT services to maximize functional abilities.    If plan is discharge home, recommend the following: A little help with walking and/or transfers;A little help with bathing/dressing/bathroom;Assistance with cooking/housework;Help with stairs or ramp for entrance;Assist for transportation   Can travel by private vehicle        Equipment Recommendations  Crutches    Recommendations for Other Services       Precautions / Restrictions Precautions Precautions: Fall Restrictions Weight Bearing Restrictions: Yes LLE Weight Bearing: Weight bearing as tolerated     Mobility  Bed Mobility Overal bed mobility: Modified Independent Bed Mobility: Supine to Sit, Sit to Supine     Supine to sit: Modified independent (Device/Increase time), Used rails, HOB elevated Sit to supine: Modified independent (Device/Increase time), HOB elevated, Used rails    General bed mobility comments: Pt able to scoot toward HOB modi    Transfers Overall transfer level: Needs assistance Equipment used: Crutches Transfers: Sit to/from Stand Sit to Stand: Contact guard assist           General transfer comment: Pt attempted STS with crutches, but unable to maintain >37min due to pain    Ambulation/Gait               General Gait Details: unable due to pain   Stairs             Wheelchair Mobility     Tilt Bed    Modified Rankin (Stroke Patients Only)       Balance Overall balance assessment: Needs assistance Sitting-balance support: No upper extremity supported, Feet supported, Feet unsupported Sitting balance-Leahy Scale: Good     Standing balance support: During functional activity, Bilateral upper extremity supported Standing balance-Leahy Scale: Fair                              Cognition Arousal: Alert Behavior During Therapy: WFL for tasks assessed/performed Overall Cognitive Status: Within Functional Limits for tasks assessed                                          Exercises      General Comments        Pertinent Vitals/Pain Pain Assessment Pain Assessment: 0-10 Pain Score: 8  (4 in  supine, 8 seated EOB) Pain Location: L knee Pain Descriptors / Indicators: Discomfort, Grimacing, Moaning, Crying, Throbbing Pain Intervention(s): Limited activity within patient's tolerance, Monitored during session, Patient requesting pain meds-RN notified    Home Living                          Prior Function            PT Goals (current goals can now be found in the care plan section) Acute Rehab PT Goals Patient Stated Goal: decreased pain Progress towards PT goals: Progressing toward goals    Frequency    Min 1X/week      PT Plan      Co-evaluation              AM-PAC PT "6 Clicks" Mobility   Outcome Measure  Help needed turning from your back  to your side while in a flat bed without using bedrails?: None Help needed moving from lying on your back to sitting on the side of a flat bed without using bedrails?: None Help needed moving to and from a bed to a chair (including a wheelchair)?: A Little Help needed standing up from a chair using your arms (e.g., wheelchair or bedside chair)?: A Little Help needed to walk in hospital room?: A Little Help needed climbing 3-5 steps with a railing? : A Lot 6 Click Score: 19    End of Session Equipment Utilized During Treatment: Gait belt Activity Tolerance: Patient limited by pain Patient left: in bed;with call bell/phone within reach;with nursing/sitter in room;with family/visitor present;Other (comment) (with MD in room) Nurse Communication: Patient requests pain meds PT Visit Diagnosis: Other abnormalities of gait and mobility (R26.89);Pain;Difficulty in walking, not elsewhere classified (R26.2) Pain - Right/Left: Left Pain - part of body: Knee     Time: 3295-1884 PT Time Calculation (min) (ACUTE ONLY): 18 min  Charges:    $Therapeutic Activity: 8-22 mins PT General Charges $$ ACUTE PT VISIT: 1 Visit                     Welton Bord, PT, SPT 3:32 PM,10/26/22

## 2022-10-26 NOTE — Progress Notes (Addendum)
  PROGRESS NOTE    Jason Mason  GUY:[id number] DOB: 1985-11-21 DOA: 10/23/2022 PCP: [facility]  158A/158A-AA  LOS: 3 days   Brief hospital course:   Assessment & Plan: Jason Mason is a 37 y.o. male with no significant medical history who presents to ED with close to three days of progressive left knee pain associated with redness and swelling as well as low grade fever and chills. Patient noted no prior episode like this in the past. Patient also denies any trauma or insect bite. 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  Abscess cx sent. Plan: --cont ceftriaxone and IV Vanc --f/u abscess cx --can weight bear as tolerated   DVT prophylaxis: Lovenox SQ Code Status: Full code  Family Communication: mother updated at bedside today Level of care: Telemetry Medical Dispo:   The patient is from: home Anticipated d/c is to: home Anticipated d/c date is: >3 days.  Still on IV abx   Subjective and Interval History:  Pt had fever last night.  Reported pain improved.  Ortho performed bedside I/D to the prepatellar bursa and expressed a large amount of brown creamy pus.   Objective: Vitals:   10/26/22 0500 10/26/22 0500 10/26/22 0803 10/26/22 1522  BP:  109/67 107/62 126/80  Pulse:  86 85 83  Resp:  16 17 16   Temp:  99.8 F (37.7 C) 99.3 F (37.4 C) 98.9 F (37.2 C)  TempSrc:      SpO2:  99% 91% 96%  Weight: 98.1 kg     Height:        Intake/Output Summary (Last 24 hours) at 10/26/2022 1850 Last data filed at 10/26/2022 1532 Gross per 24 hour  Intake 540 ml  Output 3750 ml  Net -3210 ml   Filed Weights   10/23/22 1421 10/23/22 1802  10/26/22 0500  Weight: 104.3 kg 104.3 kg 98.1 kg    Examination:   Constitutional: NAD, AAOx3 HEENT: conjunctivae and lids normal, EOMI CV: No cyanosis.   RESP: normal respiratory effort, on RA Neuro: II - XII grossly intact.   Psych: Normal mood and affect.  Appropriate judgement and reason   Data Reviewed: I have personally reviewed labs and imaging studies  Time spent: 25 minutes  Darlin Priestly, MD [facility] If 7PM-7AM, please contact night-coverage 10/26/2022, 6:50 PM

## 2022-10-26 NOTE — Plan of Care (Signed)

## 2022-10-26 NOTE — Progress Notes (Signed)
Subjective:   Patient's white count is decreased and temperature is down to 99.  However he still has a lot of soreness along the lateral side of the thigh and prepatellar bursa.  There is a lot of pain when he gets up.  I prepped the area over the prepatellar bursa sterilely and infiltrated with 1% Xylocaine with epinephrine.  After waiting 10 minutes I incised the area and was able to express a large amount of brown creamy pus.  This fluid appeared to extend to the distal aspect of the lateral thigh.  I packed the wound open and dressed it.  Fluid was sent for culture.  He did feel better following this.    Patient reports pain as moderate.  Objective:   VITALS:   Vitals:   10/26/22 0500 10/26/22 0803  BP: 109/67 107/62  Pulse: 86 85  Resp: 16 17  Temp: 99.8 F (37.7 C) 99.3 F (37.4 C)  SpO2: 99% 91%    Neurologically intact Sensation intact distally Dorsiflexion/Plantar flexion intact  LABS Recent Labs    10/24/22 0446 10/25/22 0445 10/26/22 0418  HGB 13.1 12.9* 13.0  HCT 38.0* 37.8* 38.7*  WBC 18.3* 14.4* 12.7*  PLT 182 220 239    Recent Labs    10/24/22 0446 10/25/22 0445 10/26/22 0418  NA 135 135 134*  K 3.9 3.7 3.8  BUN 10 8 8   CREATININE 0.93 0.96 0.96  GLUCOSE 115* 110* 110*    Recent Labs    10/24/22 0446  INR 1.2     Assessment/Plan:   Superficial abscess with cellulitis. Continue IV antibiotics. Will reassess daily to see if further surgical treatment is needed.

## 2022-10-26 NOTE — Progress Notes (Signed)
Pharmacy Antibiotic Note  Jason Mason is a 37 y.o. male admitted on 10/23/2022 with cellulitis.  Pharmacy has been consulted for vancomycin dosing.  Plan: Patient currently on vancomycin 1500 mg IV q12h. Vancomycin level this AM subtherapeutic at 7 mcg/mL. Will increase vancomycin to 1000 mg IV q8h. Pt is young with good renal function. Predicted AUC 534. Goal AUC 400-600. Scr 0.96, Vd 0.72, IBW. Next level due after the 4th or 5th maintenance dose.  Continue ceftriaxone 2 g daily.   Height: 5\' 8"  (172.7 cm) Weight: 98.1 kg (216 lb 4.3 oz) IBW/kg (Calculated) : 68.4  Temp (24hrs), Avg:100 F (37.8 C), Min:98.2 F (36.8 C), Max:102.4 F (39.1 C)  Recent Labs  Lab 10/23/22 1424 10/23/22 1529 10/23/22 1805 10/24/22 0446 10/25/22 0445 10/26/22 0318 10/26/22 0418  WBC 21.8*  --   --  18.3* 14.4*  --  12.7*  CREATININE 1.01  --   --  0.93 0.96  --  0.96  LATICACIDVEN 1.6 1.4 1.0  --   --   --   --   VANCORANDOM  --   --   --   --   --  7  --     Estimated Creatinine Clearance: 120.8 mL/min (by C-G formula based on SCr of 0.96 mg/dL).    No Known Allergies  Antimicrobials this admission: vancomycin 8/17 >>  ceftriaxone 8/17 >>   Dose adjustments this admission: N/a  Microbiology results: 8/17 body fluid from knee: NGTD 8/17 Bcx: NGTD  Thank you for allowing pharmacy to be a part of this patient's care.  Merryl Hacker, PharmD 10/26/2022 10:53 AM

## 2022-10-27 DIAGNOSIS — M71062 Abscess of bursa, left knee: Secondary | ICD-10-CM

## 2022-10-27 LAB — BASIC METABOLIC PANEL
Anion gap: 6 (ref 5–15)
BUN: 12 mg/dL (ref 6–20)
CO2: 27 mmol/L (ref 22–32)
Calcium: 8.4 mg/dL — ABNORMAL LOW (ref 8.9–10.3)
Chloride: 101 mmol/L (ref 98–111)
Creatinine, Ser: 1.08 mg/dL (ref 0.61–1.24)
GFR, Estimated: >60 mL/min (ref >60)
Glucose, Bld: 106 mg/dL — ABNORMAL HIGH (ref 70–99)
Potassium: 3.8 mmol/L (ref 3.5–5.1)
Sodium: 134 mmol/L — ABNORMAL LOW (ref 135–145)

## 2022-10-27 LAB — CBC
HCT: 36.9 % — ABNORMAL LOW (ref 39.0–52.0)
Hemoglobin: 12.8 g/dL — ABNORMAL LOW (ref 13.0–17.0)
MCH: 27.9 pg (ref 26.0–34.0)
MCHC: 34.7 g/dL (ref 30.0–36.0)
MCV: 80.4 fL (ref 80.0–100.0)
Platelets: 278 10*3/uL (ref 150–400)
RBC: 4.59 MIL/uL (ref 4.22–5.81)
RDW: 11.9 % (ref 11.5–15.5)
WBC: 11.3 10*3/uL — ABNORMAL HIGH (ref 4.0–10.5)
nRBC: 0 % (ref 0.0–0.2)

## 2022-10-27 LAB — MAGNESIUM: Magnesium: 2.4 mg/dL (ref 1.7–2.4)

## 2022-10-27 LAB — BODY FLUID CULTURE W GRAM STAIN: Culture: NO GROWTH

## 2022-10-27 MED ORDER — VANCOMYCIN HCL IN DEXTROSE 1-5 GM/200ML-% IV SOLN
1000.0000 mg | Freq: Two times a day (BID) | INTRAVENOUS | Status: DC
  Administered 2022-10-28: 1000 mg via INTRAVENOUS
  Filled 2022-10-27: qty 200

## 2022-10-27 MED ORDER — DIPHENHYDRAMINE HCL 50 MG/ML IJ SOLN
50.0000 mg | Freq: Once | INTRAMUSCULAR | Status: AC | PRN
  Administered 2022-10-27: 50 mg via INTRAVENOUS
  Filled 2022-10-27: qty 1

## 2022-10-27 MED ORDER — VANCOMYCIN HCL IN DEXTROSE 1-5 GM/200ML-% IV SOLN
1000.0000 mg | Freq: Two times a day (BID) | INTRAVENOUS | Status: DC
  Administered 2022-10-27: 1000 mg via INTRAVENOUS
  Filled 2022-10-27: qty 200

## 2022-10-27 NOTE — Progress Notes (Signed)
Subjective:   Clinically the patient is much better today.  Much less pain.  Slight fever still.  White count is coming down.  The dressing was examined and there is minimal drainage.  I will leave it in place till tomorrow.  He was able to stand up with much less discomfort.  Continue IV antibiotics and will reevaluate i him for any further signs of abscess tomorrow.  Patient reports pain as mild.  Objective:   VITALS:   Vitals:   10/26/22 2326 10/27/22 0812  BP: 110/77 117/72  Pulse: 95 82  Resp: 18 17  Temp: 99.9 F (37.7 C) 99.9 F (37.7 C)  SpO2: 98% 96%    Neurologically intact Sensation intact distally Dorsiflexion/Plantar flexion intact Incision: dressing C/D/I  LABS Recent Labs    10/25/22 0445 10/26/22 0418 10/27/22 0403  HGB 12.9* 13.0 12.8*  HCT 37.8* 38.7* 36.9*  WBC 14.4* 12.7* 11.3*  PLT 220 239 278    Recent Labs    10/25/22 0445 10/26/22 0418 10/27/22 0403  NA 135 134* 134*  K 3.7 3.8 3.8  BUN 8 8 12   CREATININE 0.96 0.96 1.08  GLUCOSE 110* 110* 106*    No results for input(s): "LABPT", "INR" in the last 72 hours.   Assessment/Plan:   Continue present IV antibiotic treatment and will reevaluate him again tomorrow.

## 2022-10-27 NOTE — Progress Notes (Signed)
Pharmacy Antibiotic Note  Jason Mason is a 37 y.o. male admitted on 10/23/2022 with cellulitis.  Pharmacy has been consulted for vancomycin dosing.  Plan: Patient currently on vancomycin 1000 mg IV q8h. Due to change in renal function will decrease vancomycin to vancomycin to 1000 mg IV q12h. Pt is young with good renal function. Predicted AUC 512. Goal AUC 400-600. Scr 1.08, Vd 0.72, IBW. Next level due after the 4th or 5th maintenance dose.  Continue ceftriaxone 2 g daily.   Height: 5\' 8"  (172.7 cm) Weight: 99 kg (218 lb 4.1 oz) IBW/kg (Calculated) : 68.4  Temp (24hrs), Avg:99.6 F (37.6 C), Min:98.9 F (37.2 C), Max:99.9 F (37.7 C)  Recent Labs  Lab 10/23/22 1424 10/23/22 1529 10/23/22 1805 10/24/22 0446 10/25/22 0445 10/26/22 0318 10/26/22 0418 10/27/22 0403  WBC 21.8*  --   --  18.3* 14.4*  --  12.7* 11.3*  CREATININE 1.01  --   --  0.93 0.96  --  0.96 1.08  LATICACIDVEN 1.6 1.4 1.0  --   --   --   --   --   VANCORANDOM  --   --   --   --   --  7  --   --     Estimated Creatinine Clearance: 107.8 mL/min (by C-G formula based on SCr of 1.08 mg/dL).    No Known Allergies  Antimicrobials this admission: vancomycin 8/17 >>  ceftriaxone 8/17 >>   Dose adjustments this admission: N/a  Microbiology results: 8/17 body fluid from knee: NGTD 8/17 Bcx: NGTD 8/20 abscess cx: rare gram positive cocci in clusters (prelim)  Thank you for allowing pharmacy to be a part of this patient's care.  Merryl Hacker, PharmD Clinical Pharmacist 10/27/2022 10:31 AM

## 2022-10-27 NOTE — Progress Notes (Addendum)
Physical Therapy Treatment Patient Details Name: Jason Mason MRN: [id number] DOB: 1985/07/02 Today's Date: 10/27/2022   History of Present Illness Pt is a 37 y/o M admitted on 10/23/22 after presenting with c/o 3 days progressive L knee pain associated with redness, swelling, low grade fever, & chills. Pt is being treated for LLE cellulitis & small abscess at the patella bursa region. PMH: none    PT Comments  Pt presents laying in bed with family in room, 4/10 pain in L knee. Pt able to perform supine<>sit modi and sit<>stand with supervision for safety. Pt tolerated ascending/descending 6 stairs with R rail/L crutch for stability and CGA. Pt also ambulated ~139ft with b/l crutches and CGA, noting LLE remaining in NWB position. Pt attempted TTWB but unable to maintain during ambulation due to difficulty sequencing and intolerance to weightbearing through LLE.   PT noted improvements in pain, efficiency of movement, and activity tolerance compared to previous session. Pt verbalized/ educated family member on stair negotiation at the end of session. He would benefit from continued skilled therapy to maximize functional abilities.    If plan is discharge home, recommend the following: A little help with walking and/or transfers;A little help with bathing/dressing/bathroom;Assistance with cooking/housework;Help with stairs or ramp for entrance;Assist for transportation   Can travel by private vehicle        Equipment Recommendations       Recommendations for Other Services       Precautions / Restrictions Precautions Precautions: Fall Restrictions Weight Bearing Restrictions: Yes LLE Weight Bearing: Weight bearing as tolerated     Mobility  Bed Mobility Overal bed mobility: Modified Independent Bed Mobility: Supine to Sit, Sit to Supine     Supine to sit: HOB elevated, Modified independent (Device/Increase time) Sit to supine: Modified independent (Device/Increase time), HOB  elevated        Transfers Overall transfer level: Needs assistance Equipment used: Crutches Transfers: Sit to/from Stand Sit to Stand: Supervision                Ambulation/Gait Ambulation/Gait assistance: Contact guard assist, Supervision Gait Distance (Feet): 100 Feet Assistive device: Crutches   Gait velocity: decreased     General Gait Details: maintained NWB throughout ambulation, able to intermittently TTWB   Stairs Stairs: Yes Stairs assistance: Contact guard assist Stair Management: One rail Right, With crutches Number of Stairs: 6 General stair comments: ascend/descend, pt able to perform/ teach back safe stair negotiation   Wheelchair Mobility     Tilt Bed    Modified Rankin (Stroke Patients Only)       Balance Overall balance assessment: Needs assistance Sitting-balance support: No upper extremity supported, Feet supported, Feet unsupported Sitting balance-Leahy Scale: Good     Standing balance support: During functional activity, Bilateral upper extremity supported Standing balance-Leahy Scale: Fair                              Cognition Arousal: Alert Behavior During Therapy: WFL for tasks assessed/performed Overall Cognitive Status: Within Functional Limits for tasks assessed                                          Exercises      General Comments        Pertinent Vitals/Pain Pain Assessment Pain Assessment: 0-10 Pain Score: 4  Pain Location:  L knee Pain Descriptors / Indicators: Discomfort, Grimacing    Home Living                          Prior Function            PT Goals (current goals can now be found in the care plan section) Acute Rehab PT Goals Patient Stated Goal: decreased pain Progress towards PT goals: Progressing toward goals    Frequency    Min 1X/week      PT Plan      Co-evaluation              AM-PAC PT "6 Clicks" Mobility   Outcome  Measure  Help needed turning from your back to your side while in a flat bed without using bedrails?: None Help needed moving from lying on your back to sitting on the side of a flat bed without using bedrails?: None Help needed moving to and from a bed to a chair (including a wheelchair)?: A Little Help needed standing up from a chair using your arms (e.g., wheelchair or bedside chair)?: A Little Help needed to walk in hospital room?: A Little Help needed climbing 3-5 steps with a railing? : A Little 6 Click Score: 20    End of Session Equipment Utilized During Treatment: Gait belt Activity Tolerance: Patient tolerated treatment well Patient left: in bed;with call bell/phone within reach;with family/visitor present Nurse Communication: Other (comment) (IV needing to be reattached, RN removed for mobility) PT Visit Diagnosis: Other abnormalities of gait and mobility (R26.89);Pain;Difficulty in walking, not elsewhere classified (R26.2) Pain - Right/Left: Left Pain - part of body: Knee     Time: 4098-1191 PT Time Calculation (min) (ACUTE ONLY): 27 min  Charges:    $Gait Training: 23-37 mins PT General Charges $$ ACUTE PT VISIT: 1 Visit                    Eaven Schwager, PT, SPT 4:15 PM,10/27/22

## 2022-10-27 NOTE — Progress Notes (Signed)
  PROGRESS NOTE    Jason Mason  WUJ:[id number] DOB: 24-Oct-1985 DOA: 10/23/2022 PCP: [facility]  158A/158A-AA  LOS: 4 days   Brief hospital course: Jason Mason is a 37 y.o. male with no significant medical history who presents to ED with close to three days of progressive left knee pain associated with redness and swelling as well as low grade fever and chills. Patient noted no prior episode like this in the past. Patient also denies any trauma or insect bite. 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  Orthopedic to continue IV antibiotics and they will reevaluate tomorrow to assess the need for any washout.  Assessment & Plan:   Sepsis 2/2 Left lower extremity Cellulitis  Small abscess at the patella bursa region  -s/p arthrocentesis in ED, essentially dry tap --fever, leukocytosis --Ortho performed bedside I/D to the prepatellar bursa today, and expressed a large amount of brown creamy pus.  Abscess cx sent.  Cultures with few gram-positive cocci in cluster. Plan: --cont ceftriaxone and IV Vanc --f/u abscess cx --can weight bear as tolerated   DVT prophylaxis: Lovenox SQ Code Status: Full code  Family Communication: mother updated at bedside today Level of care: Telemetry Medical Dispo:   The patient is from: home Anticipated d/c is to: home Anticipated d/c date is: >3 days.  Still on IV abx   Subjective and Interval History:  Patient with some improvement in left knee pain today.  Able to bear some weight now.  Objective: Vitals:   10/26/22 1522 10/26/22 2326 10/27/22 0500 10/27/22 0812  BP: 126/80 110/77  117/72  Pulse: 83 95  82  Resp: 16 18  17   Temp: 98.9 F (37.2 C) 99.9 F (37.7 C)  99.9 F (37.7 C)  TempSrc:      SpO2: 96% 98%  96%  Weight:   99 kg   Height:        Intake/Output Summary (Last 24  hours) at 10/27/2022 1432 Last data filed at 10/27/2022 0900 Gross per 24 hour  Intake 462.35 ml  Output 1900 ml  Net -1437.65 ml   Filed Weights   10/23/22 1802 10/26/22 0500 10/27/22 0500  Weight: 104.3 kg 98.1 kg 99 kg    Examination:  General.  Well-developed gentleman, in no acute distress. Pulmonary.  Lungs clear bilaterally, normal respiratory effort. CV.  Regular rate and rhythm, no JVD, rub or murmur. Abdomen.  Soft, nontender, nondistended, BS positive. CNS.  Alert and oriented .  No focal neurologic deficit. Extremities.  No edema, no cyanosis, pulses intact and symmetrical.  Left knee with Ace wrap Psychiatry.  Judgment and insight appears normal.   Data Reviewed: I have personally reviewed labs and imaging studies  Time spent: 40 minutes  This record has been created using Conservation officer, historic buildings. Errors have been sought and corrected,but may not always be located. Such creation errors do not reflect on the standard of care.   Arnetha Courser, MD [facility] If 7PM-7AM, please contact night-coverage 10/27/2022, 2:32 PM

## 2022-10-27 NOTE — TOC Progression Note (Addendum)
Transition of Care [facility]) - Progression Note    Patient Details  Name: Jason Mason MRN: [id number] Date of Birth: 05/05/1985  [facility] CM/SW Contact  Marlowe Sax, RN Phone Number: 10/27/2022, 2:47 PM  Clinical Narrative:     TOC continues to follow for Needs and will assist with DC planning    Orthopedic to continue IV antibiotics and they will reevaluate tomorrow to assess the need for any washout.    [employer] delivered the crutches to the bedside  Expected Discharge Plan and Services                                               Social Determinants of Health (SDOH) Interventions SDOH Screenings   Food Insecurity: No Food Insecurity (10/23/2022)  Housing: Low Risk  (10/23/2022)  Transportation Needs: No Transportation Needs (10/23/2022)  Utilities: Not At Risk (10/23/2022)  Tobacco Use: Low Risk  (10/23/2022)    Readmission Risk Interventions     No data to display

## 2022-10-27 NOTE — Progress Notes (Signed)
PT Cancellation Note  Patient Details Name: Jason Mason MRN: [id number] DOB: 09/21/1985   Cancelled Treatment:    Reason Eval/Treat Not Completed: Other (comment). Per pt, he reported the MD reported to hold on PT for now. PT to re-attempt as able.   Olga Coaster PT, DPT 8:56 AM,10/27/22

## 2022-10-28 DIAGNOSIS — M71062 Abscess of bursa, left knee: Secondary | ICD-10-CM | POA: Diagnosis not present

## 2022-10-28 LAB — CULTURE, BLOOD (ROUTINE X 2)
Culture: NO GROWTH
Culture: NO GROWTH
Special Requests: ADEQUATE

## 2022-10-28 LAB — CBC
HCT: 38.9 % — ABNORMAL LOW (ref 39.0–52.0)
Hemoglobin: 12.9 g/dL — ABNORMAL LOW (ref 13.0–17.0)
MCH: 27.1 pg (ref 26.0–34.0)
MCHC: 33.2 g/dL (ref 30.0–36.0)
MCV: 81.7 fL (ref 80.0–100.0)
Platelets: 318 10*3/uL (ref 150–400)
RBC: 4.76 MIL/uL (ref 4.22–5.81)
RDW: 11.8 % (ref 11.5–15.5)
WBC: 11.5 10*3/uL — ABNORMAL HIGH (ref 4.0–10.5)
nRBC: 0 % (ref 0.0–0.2)

## 2022-10-28 LAB — BASIC METABOLIC PANEL
Anion gap: 10 (ref 5–15)
BUN: 11 mg/dL (ref 6–20)
CO2: 25 mmol/L (ref 22–32)
Calcium: 8.4 mg/dL — ABNORMAL LOW (ref 8.9–10.3)
Chloride: 101 mmol/L (ref 98–111)
Creatinine, Ser: 0.95 mg/dL (ref 0.61–1.24)
GFR, Estimated: >60 mL/min (ref >60)
Glucose, Bld: 103 mg/dL — ABNORMAL HIGH (ref 70–99)
Potassium: 4 mmol/L (ref 3.5–5.1)
Sodium: 136 mmol/L (ref 135–145)

## 2022-10-28 LAB — MAGNESIUM: Magnesium: 2.5 mg/dL — ABNORMAL HIGH (ref 1.7–2.4)

## 2022-10-28 MED ORDER — VANCOMYCIN HCL 500 MG/100ML IV SOLN
500.0000 mg | Freq: Once | INTRAVENOUS | Status: DC
  Filled 2022-10-28: qty 100

## 2022-10-28 MED ORDER — VANCOMYCIN HCL 500 MG/100ML IV SOLN
500.0000 mg | Freq: Once | INTRAVENOUS | Status: AC
  Administered 2022-10-28: 500 mg via INTRAVENOUS
  Filled 2022-10-28: qty 100

## 2022-10-28 MED ORDER — VANCOMYCIN HCL 1500 MG/300ML IV SOLN
1500.0000 mg | Freq: Two times a day (BID) | INTRAVENOUS | Status: DC
  Administered 2022-10-28 – 2022-10-29 (×2): 1500 mg via INTRAVENOUS
  Filled 2022-10-28 (×3): qty 300

## 2022-10-28 NOTE — Plan of Care (Signed)

## 2022-10-28 NOTE — Progress Notes (Signed)
Subjective:   Patient continues to feel better daily.  Much less pain.  He is afebrile.  White count remains minimally elevated.  He is getting more mobile.  Cultures did grow out Staph aureus.  Sensitivities pending still.  There is a severity of his infection would indicate probable MRSA. Dressings were changed.  Small amount of purulence was expressed.  Appears to be improving as far as this is concerned as well.  No new abscess has been noted.    Patient reports pain as mild.  Objective:   VITALS:   Vitals:   10/28/22 0808 10/28/22 1527  BP: 115/65 105/67  Pulse: 72 82  Resp: 16   Temp: 98.5 F (36.9 C) 98.5 F (36.9 C)  SpO2: 99% 98%    Neurologically intact Sensation intact distally Dorsiflexion/Plantar flexion intact  LABS Recent Labs    10/26/22 0418 10/27/22 0403 10/28/22 0220  HGB 13.0 12.8* 12.9*  HCT 38.7* 36.9* 38.9*  WBC 12.7* 11.3* 11.5*  PLT 239 278 318    Recent Labs    10/26/22 0418 10/27/22 0403 10/28/22 0220  NA 134* 134* 136  K 3.8 3.8 4.0  BUN 8 12 11   CREATININE 0.96 1.08 0.95  GLUCOSE 110* 106* 103*    No results for input(s): "LABPT", "INR" in the last 72 hours.   Assessment/Plan:   Continue IV antibiotic therapy. I do not detect any new abscess that needs to be addressed.

## 2022-10-28 NOTE — Progress Notes (Signed)
Pharmacy Antibiotic Note  Jason Mason is a 37 y.o. male admitted on 10/23/2022 with cellulitis.  Pharmacy has been consulted for vancomycin dosing.  Plan: Patient currently on vancomycin 1000 mg IV q12h. Due to change in renal function will increase vancomycin to vancomycin to 1500 mg IV q12h. Pt is young with good renal function. Predicted AUC 466.3 Goal AUC 400-600. Predicted Css min 12.3. Scr 0.95, Vd 0.72, IBW. Given culture results, will give an additional 500 mg IV vanc this AM to total 1500 mg and check a level prior to the 4th maintenance dose. Continue to infuse over a slower rate due to concern for [facility] Syndrome.  Will discontinue ceftriaxone given culture results showing abundant Staph aureus.   Height: 5\' 8"  (172.7 cm) Weight: 98.5 kg (217 lb 2.5 oz) IBW/kg (Calculated) : 68.4  Temp (24hrs), Avg:98.2 F (36.8 C), Min:97.7 F (36.5 C), Max:98.5 F (36.9 C)  Recent Labs  Lab 10/23/22 1424 10/23/22 1529 10/23/22 1805 10/24/22 0446 10/25/22 0445 10/26/22 0318 10/26/22 0418 10/27/22 0403 10/28/22 0220  WBC 21.8*  --   --  18.3* 14.4*  --  12.7* 11.3* 11.5*  CREATININE 1.01  --   --  0.93 0.96  --  0.96 1.08 0.95  LATICACIDVEN 1.6 1.4 1.0  --   --   --   --   --   --   VANCORANDOM  --   --   --   --   --  7  --   --   --     Estimated Creatinine Clearance: 122.2 mL/min (by C-G formula based on SCr of 0.95 mg/dL).    Allergies  Allergen Reactions   Vancomycin     Redman Syndrome Reaction - Double infusion times.    Antimicrobials this admission: vancomycin 8/17 >>  ceftriaxone 8/17 >> 8/22   Microbiology results: 8/17 body fluid from knee: NGTD 8/17 Bcx: NGTD 8/20 abscess cx: abundant Staph aureus (prelim)  Thank you for allowing pharmacy to be a part of this patient's care.  Merryl Hacker, PharmD Clinical Pharmacist 10/28/2022 9:39 AM

## 2022-10-28 NOTE — Progress Notes (Signed)
  PROGRESS NOTE    Jason Mason  ZOX:[id number] DOB: [birth year] DOA: 10/23/2022 PCP: [facility]  158A/158A-AA  LOS: 5 days   Brief hospital course: Jason Mason is a 37 y.o. male with no significant medical history who presents to ED with close to three days of progressive left knee pain associated with redness and swelling as well as low grade fever and chills. Patient noted no prior episode like this in the past. Patient also denies any trauma or insect bite. 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  Orthopedic to continue IV antibiotics and they will reevaluate tomorrow to assess the need for any washout.  8/22: Cultures growing Staph aureus-pending susceptibility discontinuing ceftriaxone and will continue with vancomycin at this time.  Orthopedic to decide whether he need another procedure/washout  Assessment & Plan:   Sepsis 2/2 Left lower extremity Cellulitis  Small abscess at the patella bursa region  -s/p arthrocentesis in ED, essentially dry tap --fever, leukocytosis --Ortho performed bedside I/D to the prepatellar bursa , and expressed a large amount of brown creamy pus.  Abscess cx sent.  Cultures with Staph aureus-pending susceptibility. -Stop ceftriaxone -Continue vancomycin --f/u abscess cx --can weight bear as tolerated -Orthopedic to decide about the need of washout.   DVT prophylaxis: Lovenox SQ Code Status: Full code  Family Communication: mother updated at bedside today Level of care: Telemetry Medical Dispo:   The patient is from: home Anticipated d/c is to: home Anticipated d/c date is: >3 days.  Still on IV abx   Subjective and Interval History:  Patient was seen and examined today, improving left knee pain and able to bear weight.  Mother at bedside  Objective: Vitals:   10/28/22 0131 10/28/22  0500 10/28/22 0808 10/28/22 1527  BP: 112/69  115/65 105/67  Pulse: 73  72 82  Resp: 20  16   Temp: 98.5 F (36.9 C)  98.5 F (36.9 C) 98.5 F (36.9 C)  TempSrc:    Oral  SpO2: 96%  99% 98%  Weight:  98.5 kg    Height:        Intake/Output Summary (Last 24 hours) at 10/28/2022 1621 Last data filed at 10/28/2022 1443 Gross per 24 hour  Intake 240 ml  Output 1100 ml  Net -860 ml   Filed Weights   10/26/22 0500 10/27/22 0500 10/28/22 0500  Weight: 98.1 kg 99 kg 98.5 kg    Examination:  General.  Well-developed gentleman, in no acute distress. Pulmonary.  Lungs clear bilaterally, normal respiratory effort. CV.  Regular rate and rhythm, no JVD, rub or murmur. Abdomen.  Soft, nontender, nondistended, BS positive. CNS.  Alert and oriented .  No focal neurologic deficit. Extremities.  No edema, no cyanosis, pulses intact and symmetrical.  Left knee with Ace wrap Psychiatry.  Judgment and insight appears normal.   Data Reviewed: I have personally reviewed labs and imaging studies  Time spent: 42 minutes  This record has been created using Conservation officer, historic buildings. Errors have been sought and corrected,but may not always be located. Such creation errors do not reflect on the standard of care.   Arnetha Courser, MD [facility] If 7PM-7AM, please contact night-coverage 10/28/2022, 4:21 PM

## 2022-10-28 NOTE — Plan of Care (Signed)
  Problem: Education: Goal: Knowledge of General Education information will improve Description: Including pain rating scale, medication(s)/side effects and non-pharmacologic comfort measures 10/28/2022 1817 by Grayland Ormond, LPN Outcome: Progressing 10/28/2022 0734 by Grayland Ormond, LPN Outcome: Progressing   Problem: Health Behavior/Discharge Planning: Goal: Ability to manage health-related needs will improve 10/28/2022 1817 by Grayland Ormond, LPN Outcome: Progressing 10/28/2022 0734 by Grayland Ormond, LPN Outcome: Progressing   Problem: Clinical Measurements: Goal: Ability to maintain clinical measurements within normal limits will improve 10/28/2022 1817 by Grayland Ormond, LPN Outcome: Progressing 10/28/2022 0734 by Grayland Ormond, LPN Outcome: Progressing Goal: Will remain free from infection 10/28/2022 1817 by Grayland Ormond, LPN Outcome: Progressing 10/28/2022 0734 by Grayland Ormond, LPN Outcome: Progressing Goal: Diagnostic test results will improve 10/28/2022 1817 by Grayland Ormond, LPN Outcome: Progressing 10/28/2022 0734 by Grayland Ormond, LPN Outcome: Progressing Goal: Respiratory complications will improve 10/28/2022 1817 by Grayland Ormond, LPN Outcome: Progressing 10/28/2022 0734 by Grayland Ormond, LPN Outcome: Progressing Goal: Cardiovascular complication will be avoided 10/28/2022 1817 by Grayland Ormond, LPN Outcome: Progressing 10/28/2022 0734 by Grayland Ormond, LPN Outcome: Progressing   Problem: Activity: Goal: Risk for activity intolerance will decrease 10/28/2022 1817 by Grayland Ormond, LPN Outcome: Progressing 10/28/2022 0734 by Grayland Ormond, LPN Outcome: Progressing   Problem: Nutrition: Goal: Adequate nutrition will be maintained 10/28/2022 1817 by Grayland Ormond, LPN Outcome: Progressing 10/28/2022 0734 by Grayland Ormond, LPN Outcome: Progressing   Problem: Coping: Goal: Level  of anxiety will decrease 10/28/2022 1817 by Grayland Ormond, LPN Outcome: Progressing 10/28/2022 0734 by Grayland Ormond, LPN Outcome: Progressing   Problem: Elimination: Goal: Will not experience complications related to bowel motility 10/28/2022 1817 by Grayland Ormond, LPN Outcome: Progressing 10/28/2022 0734 by Grayland Ormond, LPN Outcome: Progressing Goal: Will not experience complications related to urinary retention 10/28/2022 1817 by Grayland Ormond, LPN Outcome: Progressing 10/28/2022 0734 by Grayland Ormond, LPN Outcome: Progressing   Problem: Pain Managment: Goal: General experience of comfort will improve 10/28/2022 1817 by Grayland Ormond, LPN Outcome: Progressing 10/28/2022 0734 by Grayland Ormond, LPN Outcome: Progressing   Problem: Safety: Goal: Ability to remain free from injury will improve 10/28/2022 1817 by Grayland Ormond, LPN Outcome: Progressing 10/28/2022 0734 by Grayland Ormond, LPN Outcome: Progressing   Problem: Skin Integrity: Goal: Risk for impaired skin integrity will decrease 10/28/2022 1817 by Grayland Ormond, LPN Outcome: Progressing 10/28/2022 0734 by Grayland Ormond, LPN Outcome: Progressing

## 2022-10-29 DIAGNOSIS — L03116 Cellulitis of left lower limb: Principal | ICD-10-CM

## 2022-10-29 LAB — CBC
HCT: 37.8 % — ABNORMAL LOW (ref 39.0–52.0)
Hemoglobin: 12.8 g/dL — ABNORMAL LOW (ref 13.0–17.0)
MCH: 27.5 pg (ref 26.0–34.0)
MCHC: 33.9 g/dL (ref 30.0–36.0)
MCV: 81.1 fL (ref 80.0–100.0)
Platelets: 343 10*3/uL (ref 150–400)
RBC: 4.66 MIL/uL (ref 4.22–5.81)
RDW: 11.9 % (ref 11.5–15.5)
WBC: 12.6 10*3/uL — ABNORMAL HIGH (ref 4.0–10.5)
nRBC: 0 % (ref 0.0–0.2)

## 2022-10-29 LAB — BASIC METABOLIC PANEL
Anion gap: 14 (ref 5–15)
BUN: 12 mg/dL (ref 6–20)
CO2: 23 mmol/L (ref 22–32)
Calcium: 8.8 mg/dL — ABNORMAL LOW (ref 8.9–10.3)
Chloride: 102 mmol/L (ref 98–111)
Creatinine, Ser: 0.87 mg/dL (ref 0.61–1.24)
GFR, Estimated: >60 mL/min (ref >60)
Glucose, Bld: 112 mg/dL — ABNORMAL HIGH (ref 70–99)
Potassium: 4 mmol/L (ref 3.5–5.1)
Sodium: 139 mmol/L (ref 135–145)

## 2022-10-29 LAB — AEROBIC CULTURE W GRAM STAIN (SUPERFICIAL SPECIMEN)

## 2022-10-29 LAB — MAGNESIUM: Magnesium: 2.3 mg/dL (ref 1.7–2.4)

## 2022-10-29 MED ORDER — CEPHALEXIN 500 MG PO CAPS: 500.0000 mg | ORAL_CAPSULE | Freq: Four times a day (QID) | ORAL | 0 refills | Status: AC

## 2022-10-29 MED ORDER — CEPHALEXIN 500 MG PO CAPS: 500.0000 mg | ORAL_CAPSULE | Freq: Four times a day (QID) | ORAL | Status: DC

## 2022-10-29 MED ORDER — OXYCODONE HCL 5 MG PO TABS: 5.0000 mg | ORAL_TABLET | ORAL | 0 refills | Status: AC | PRN

## 2022-10-29 NOTE — Discharge Summary (Addendum)
Physician Discharge Summary   Patient: Jason Mason MRN: [id number] DOB: 1986/02/12  Admit date:     10/23/2022  Discharge date: 10/29/22  Discharge Physician: Arnetha Courser   PCP: [facility]   Recommendations at discharge:  Please obtain CBC and BMP on follow-up Follow-up with orthopedic surgery within next 5 days Please and sure the completion of antibiotic Follow-up with primary care provider  Discharge Diagnoses: Active Problems:   Cellulitis of left knee Left knee prepatellar bursa infection.  Hospital Course: Jason Mason is a 37 y.o. male with no significant medical history who presents to ED with close to three days of progressive left knee pain associated with redness and swelling as well as low grade fever and chills. Patient noted no prior episode like this in the past. Patient also denies any trauma or insect bite. 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  Orthopedic was consulted for concern of knee infection, s/p arthrocentesis with removal of purulent discharge, concern of prepatellar bursitis.  Cultures grew MSSA.  Patient initially received broad-spectrum antibiotics followed by vancomycin in hospital and discharged on Keflex for 1 more week to complete a total of 10-day course.  Patient continued to improve and able to ambulate without any difficulty.  Patient did met sepsis criteria on admission.  He was advised to stay away from being on knees until his wound heals completely and he completed the course of antibiotic.  His orthopedic surgeon can let him know when it will be safe.  Patient will follow-up with orthopedic surgery as outpatient for further recommendations.  Consultants: Orthopedic surgery Procedures performed: Left knee arthrocentesis Disposition: Home Diet recommendation:  Discharge Diet Orders (From admission, onward)     Start     Ordered   10/29/22 0000  Diet - low sodium heart  healthy        10/29/22 1236           Regular diet DISCHARGE MEDICATION: Allergies as of 10/29/2022       Reactions   Vancomycin    Redman Syndrome Reaction - Double infusion times.        Medication List     STOP taking these medications    ondansetron 4 MG disintegrating tablet Commonly known as: ZOFRAN-ODT       TAKE these medications    cephALEXin 500 MG capsule Commonly known as: KEFLEX Take 1 capsule (500 mg total) by mouth every 6 (six) hours for 7 days.   ibuprofen 600 MG tablet Commonly known as: ADVIL Take 1 tablet (600 mg total) by mouth every 6 (six) hours as needed.   oxyCODONE 5 MG immediate release tablet Commonly known as: Oxy IR/ROXICODONE Take 1 tablet (5 mg total) by mouth every 4 (four) hours as needed for moderate pain.               Durable Medical Equipment  (From admission, onward)           Start     Ordered   10/27/22 1611  For home use only DME Crutches  Once        10/27/22 1611              Discharge Care Instructions  (From admission, onward)           Start     Ordered   10/29/22 0000  Discharge wound care:       Comments: Please cover  up with clean gauze pad followed by Ace wrap to keep it clean, change as needed. You can clean the wound with normal saline   10/29/22 1236            Follow-up Information     [facility]. Go on 01/04/2023.   Why: New patient appointment @ 8 am with Larae Grooms Contact information: [address] [place] [address] [phone]         Deeann Saint, MD. Go on 11/11/2022.   Specialty: Orthopedic Surgery Why: Appointment on Thursday, Sept. 5 @ 1:15 pm Contact information: [address] [place] [address] [phone]         Deeann Saint, MD. Go on 11/04/2022.   Specialty: Orthopedic Surgery Why: For wound re-check, For re-evaluation  Appointment on Thursday 29th @ 2:00 pm Contact information: [address] [place] [address] [phone]                Discharge Exam: Ceasar Mons Weights   10/26/22 0500 10/27/22 0500 10/28/22 0500  Weight: 98.1 kg 99 kg 98.5 kg   General.  Well-developed gentleman, in no acute distress. Pulmonary.  Lungs clear bilaterally, normal respiratory effort. CV.  Regular rate and rhythm, no JVD, rub or murmur. Abdomen.  Soft, nontender, nondistended, BS positive. CNS.  Alert and oriented .  No focal neurologic deficit. Extremities.  No edema, no cyanosis, pulses intact and symmetrical. Psychiatry.  Judgment and insight appears normal.   Condition at discharge: stable  The results of significant diagnostics from this hospitalization (including imaging, microbiology, ancillary and laboratory) are listed below for reference.   Imaging Studies: MR KNEE LEFT W WO CONTRAST  Result Date: 10/24/2022 CLINICAL DATA:  Septic arthritis EXAM: MRI OF THE LEFT KNEE WITHOUT AND WITH CONTRAST TECHNIQUE: Multiplanar, multisequence MR imaging of the left knee was performed both before and after administration of intravenous contrast. CONTRAST:  10mL GADAVIST GADOBUTROL 1 MMOL/ML IV SOLN COMPARISON:  None Available. FINDINGS: MENISCI Medial: Intact. Lateral: Intact. LIGAMENTS Cruciates: ACL and PCL are intact. Collaterals: Medial collateral ligament is intact. Lateral collateral ligament complex is intact. CARTILAGE Patellofemoral:  No chondral defect. Medial:  Small cartilage fissure of the medial tibial plateau. Lateral:  No chondral defect. JOINT: No joint effusion. Mild edema in Hoffa's fat-pad. No plical thickening. POPLITEAL FOSSA: Popliteus tendon is intact. No Baker's cyst. EXTENSOR MECHANISM: Intact quadriceps tendon. Intact patellar tendon. Intact lateral patellar retinaculum. Intact medial patellar retinaculum. Intact MPFL. BONES: No aggressive osseous lesion. No fracture or dislocation. Fragmentation of the tibial tuberosity as can be seen with Osgood-Schlatter  disease. Other: No fluid collection or hematoma. Muscles are normal. Severe soft tissue edema along the anterior half of the knee with skin thickening consistent with cellulitis. Complex fluid collection along the anterolateral aspect of the knee extending from the mid tibial metaphysis to the tibial tuberosity consistent with an abscess measuring approximately 1.4 x 7.3 x 13.6 cm. IMPRESSION: 1. Severe soft tissue edema along the anterior half of the knee with skin thickening consistent with cellulitis. Complex fluid collection along the anterolateral aspect of the knee extending from the mid tibial metaphysis to the tibial tuberosity consistent with an abscess measuring approximately 1.4 x 7.3 x 13.6 cm. 2. Mild edema in Hoffa's fat which may be reactive secondary to adjacent inflammation versus an infectious etiology. Otherwise no evidence of septic arthritis. Electronically Signed   By: Elige Ko M.D.   On: 10/24/2022 09:58   US Venous Img  Lower Unilateral Left (DVT)  Result Date: 10/23/2022 CLINICAL DATA:  Left leg pain and swelling EXAM: LEFT LOWER EXTREMITY VENOUS DOPPLER ULTRASOUND TECHNIQUE: Gray-scale sonography with compression, as well as color and duplex ultrasound, were performed to evaluate the deep venous system(s) from the level of the common femoral vein through the popliteal and proximal calf veins. COMPARISON:  None Available. FINDINGS: VENOUS Normal compressibility of the common femoral, superficial femoral, and popliteal veins, as well as the visualized calf veins. Visualized portions of profunda femoral vein and great saphenous vein unremarkable. No filling defects to suggest DVT on grayscale or color Doppler imaging. Doppler waveforms show normal direction of venous flow, normal respiratory plasticity and response to augmentation. Limited views of the contralateral common femoral vein are unremarkable. OTHER None. Limitations: none IMPRESSION: Negative. Electronically Signed   By:  Helyn Numbers M.D.   On: 10/23/2022 23:54   CT EXTREMITY LOWER LEFT W CONTRAST  Result Date: 10/23/2022 CLINICAL DATA:  Left knee cellulitis. EXAM: CT OF THE LOWER LEFT EXTREMITY WITH CONTRAST TECHNIQUE: Multidetector CT imaging of the lower left extremity was performed according to the standard protocol following intravenous contrast administration. RADIATION DOSE REDUCTION: This exam was performed according to the departmental dose-optimization program which includes automated exposure control, adjustment of the mA and/or kV according to patient size and/or use of iterative reconstruction technique. CONTRAST:  OMNIPAQUE IOHEXOL 350 MG/ML SOLN COMPARISON:  None Available. FINDINGS: Bones/Joint/Cartilage No fracture or dislocation. Normal alignment. No joint effusion. Ligaments Ligaments are suboptimally evaluated by CT. Muscles and Tendons Muscles are normal. No muscle atrophy. No intramuscular fluid collection or hematoma. Patellar tendon and quadriceps tendon are intact. Fragmentation of the tibial tuberosity at the patellar tendon insertion as can be seen with old Osgood-Schlatter disease. Soft tissue No soft tissue mass. Soft tissue edema in the subcutaneous fat involving the distal thigh extending into the lower leg most severe along the anterolateral aspect of the knee with overlying skin thickening most concerning for cellulitis. 2.2 x 1.1 x 3 cm fluid collection superficial to the tibial tuberosity consistent with an abscess. Normal neurovascular bundles. IMPRESSION: 1. Soft tissue edema in the subcutaneous fat involving the distal thigh extending into the lower leg most severe along the anterolateral aspect of the knee with overlying skin thickening most concerning for cellulitis. A 2.2 x 1.1 x 3 cm fluid collection superficial to the tibial tuberosity consistent with an abscess. Electronically Signed   By: Elige Ko M.D.   On: 10/23/2022 17:29   DG Knee 2 Views Left  Result Date:  10/23/2022 CLINICAL DATA:  Swelling.  Possible septic joint.  No injury. EXAM: LEFT KNEE - 1-2 VIEW COMPARISON:  None Available. FINDINGS: There is significant anterior knee soft tissue swelling. There is evidence of old Osgood-Schlatter's disease. There is no acute fracture. Joint spaces are well maintained. No cortical erosions are identified. Small knee joint effusion. IMPRESSION: 1. Significant anterior knee soft tissue swelling. Small knee joint effusion. 2. No acute osseous abnormality. Electronically Signed   By: Darliss Cheney M.D.   On: 10/23/2022 15:18    Microbiology: Results for orders placed or performed during the hospital encounter of 10/23/22  Blood culture (routine x 2)     Status: None   Collection Time: 10/23/22  2:24 PM   Specimen: BLOOD  Result Value Ref Range Status   Specimen Description BLOOD BLOOD RIGHT ARM  Final   Special Requests   Final    BOTTLES DRAWN AEROBIC AND ANAEROBIC Blood Culture  results may not be optimal due to an excessive volume of blood received in culture bottles   Culture   Final    NO GROWTH 5 DAYS Performed at [facility], [address] [place], [place] [address]    Report Status 10/28/2022 FINAL  Final  Blood culture (routine x 2)     Status: None   Collection Time: 10/23/22  3:30 PM   Specimen: BLOOD  Result Value Ref Range Status   Specimen Description BLOOD LEFT ANTECUBITAL  Final   Special Requests   Final    BOTTLES DRAWN AEROBIC AND ANAEROBIC Blood Culture adequate volume   Culture   Final    NO GROWTH 5 DAYS Performed at [facility], [address] [place], [place] [address]    Report Status 10/28/2022 FINAL  Final  Body fluid culture w Gram Stain     Status: None   Collection Time: 10/23/22  3:30 PM   Specimen: Synovium; Body Fluid  Result Value Ref Range Status   Specimen Description   Final    SYNOVIAL Performed at [facility], [address] [place], [place] [address]    Special Requests    Final    NONE Performed at [facility], [address] [place], [place] [address]    Gram Stain   Final    FEW WBC PRESENT, PREDOMINANTLY PMN NO ORGANISMS SEEN    Culture   Final    NO GROWTH 3 DAYS Performed at [facility], [address], [place], [place] [address]    Report Status 10/27/2022 FINAL  Final  Aerobic Culture w Gram Stain (superficial specimen)     Status: None   Collection Time: 10/26/22  2:34 PM   Specimen: Wound  Result Value Ref Range Status   Specimen Description WOUND  Final   Special Requests NONE  Final   Gram Stain   Final    FEW WBC PRESENT, PREDOMINANTLY MONONUCLEAR RARE GRAM POSITIVE COCCI IN CLUSTERS Performed at [facility], [address], [place], [place] [address]    Culture ABUNDANT STAPHYLOCOCCUS AUREUS  Final   Report Status 10/29/2022 FINAL  Final   Organism ID, Bacteria STAPHYLOCOCCUS AUREUS  Final      Susceptibility   Staphylococcus aureus - MIC*    CIPROFLOXACIN <=0.5 SENSITIVE Sensitive     ERYTHROMYCIN <=0.25 SENSITIVE Sensitive     GENTAMICIN <=0.5 SENSITIVE Sensitive     OXACILLIN 0.5 SENSITIVE Sensitive     TETRACYCLINE <=1 SENSITIVE Sensitive     VANCOMYCIN <=0.5 SENSITIVE Sensitive     TRIMETH/SULFA <=10 SENSITIVE Sensitive     CLINDAMYCIN <=0.25 SENSITIVE Sensitive     RIFAMPIN <=0.5 SENSITIVE Sensitive     Inducible Clindamycin NEGATIVE Sensitive     LINEZOLID 2 SENSITIVE Sensitive     * ABUNDANT STAPHYLOCOCCUS AUREUS    Labs: CBC: Recent Labs  Lab 10/23/22 1424 10/24/22 0446 10/25/22 0445 10/26/22 0418 10/27/22 0403 10/28/22 0220 10/29/22 0532  WBC 21.8*   < > 14.4* 12.7* 11.3* 11.5* 12.6*  NEUTROABS 19.3*  --   --   --   --   --   --   HGB 15.0   < > 12.9* 13.0 12.8* 12.9* 12.8*  HCT 46.1   < > 37.8* 38.7* 36.9* 38.9* 37.8*  MCV 83.5   < > 81.8 81.8 80.4 81.7 81.1  PLT 210   < > 220 239 278 318 343   < > =  values in this interval not displayed.   Basic Metabolic Panel: Recent  Labs  Lab 10/25/22 0445 10/26/22 0418 10/27/22 0403 10/28/22 0220 10/29/22 0532  NA 135 134* 134* 136 139  K 3.7 3.8 3.8 4.0 4.0  CL 103 101 101 101 102  CO2 26 24 27 25 23   GLUCOSE 110* 110* 106* 103* 112*  BUN 8 8 12 11 12   CREATININE 0.96 0.96 1.08 0.95 0.87  CALCIUM 8.3* 8.5* 8.4* 8.4* 8.8*  MG 2.1 2.4 2.4 2.5* 2.3   Liver Function Tests: Recent Labs  Lab 10/23/22 1424 10/24/22 0446  AST 18 16  ALT 25 22  ALKPHOS 54 49  BILITOT 1.7* 1.3*  PROT 8.3* 7.1  ALBUMIN 4.5 3.4*   CBG: No results for input(s): "GLUCAP" in the last 168 hours.  Discharge time spent: greater than 30 minutes.  This record has been created using Conservation officer, historic buildings. Errors have been sought and corrected,but may not always be located. Such creation errors do not reflect on the standard of care.   Signed: Arnetha Courser, MD [facility] 10/29/2022

## 2022-10-29 NOTE — Progress Notes (Signed)
Subjective:   Patient is feeling much better today.  Much less pain.  He has been afebrile for 24 hours.  Minimal leukocytosis.  May convert to p.o. antibiotics and discharge as indicated.  He should follow-up in my office in 5 days.  I advised patient and his mother to contact my office at any time if there are any problems.  Our urgent care is open 9-9 7 days a week as well. Wound cultures came back as penicillin sensitive staph.  Patient reports pain as mild.  Objective:   VITALS:   Vitals:   10/29/22 0026 10/29/22 0928  BP: 122/67 118/76  Pulse: 77 77  Resp: 16 17  Temp: 98.6 F (37 C) 98.5 F (36.9 C)  SpO2: 98% 99%    Neurologically intact Intact pulses distally Dorsiflexion/Plantar flexion intact Incision: scant drainage  LABS Recent Labs    10/27/22 0403 10/28/22 0220 10/29/22 0532  HGB 12.8* 12.9* 12.8*  HCT 36.9* 38.9* 37.8*  WBC 11.3* 11.5* 12.6*  PLT 278 318 343    Recent Labs    10/27/22 0403 10/28/22 0220 10/29/22 0532  NA 134* 136 139  K 3.8 4.0 4.0  BUN 12 11 12   CREATININE 1.08 0.95 0.87  GLUCOSE 106* 103* 112*    No results for input(s): "LABPT", "INR" in the last 72 hours.   Assessment/Plan:   May discharge on p.o. antibiotics when indicated. Follow-up in my office in 5 days. Call my office for any concerns or problems.

## 2022-10-29 NOTE — Plan of Care (Signed)
  Problem: Education: Goal: Knowledge of General Education information will improve Description: Including pain rating scale, medication(s)/side effects and non-pharmacologic comfort measures Outcome: Progressing   Problem: Health Behavior/Discharge Planning: Goal: Ability to manage health-related needs will improve Outcome: Progressing   Problem: Clinical Measurements: Goal: Ability to maintain clinical measurements within normal limits will improve Outcome: Progressing Goal: Diagnostic test results will improve Outcome: Progressing   Problem: Activity: Goal: Risk for activity intolerance will decrease Outcome: Progressing   Problem: Nutrition: Goal: Adequate nutrition will be maintained Outcome: Progressing   Problem: Elimination: Goal: Will not experience complications related to bowel motility Outcome: Progressing Goal: Will not experience complications related to urinary retention Outcome: Progressing   Problem: Pain Managment: Goal: General experience of comfort will improve Outcome: Progressing   

## 2023-01-03 NOTE — Progress Notes (Deleted)
There were no vitals taken for this visit.   Subjective:    Patient ID: Jason Mason, male    DOB: May 31, 1985, 37 y.o.   MRN: [id number]  HPI: Jason Mason is a 37 y.o. male  No chief complaint on file.  Patient presents to clinic to establish care with new PCP.  Introduced to Publishing rights manager role and practice setting.  All questions answered.  Discussed provider/patient relationship and expectations.  Patient reports a history of ***. Patient denies a history of: Hypertension, Elevated Cholesterol, Diabetes, Thyroid problems, Depression, Anxiety, Neurological problems, and Abdominal problems.   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   Cellulitis of left knee 10/29/2022   Resolved Ambulatory Problems    Diagnosis Date Noted   No Resolved Ambulatory Problems   No Additional Past Medical History   Past Surgical History:  Procedure Laterality Date   ANTERIOR CRUCIATE LIGAMENT REPAIR Right    Family History  Problem Relation Age of Onset   Diabetes Mother    Diabetes Father    Hypertension Father    Cancer Maternal Grandmother        bone marrow cancer   Cancer Maternal Grandfather        bone marrow cancer     Review of Systems  Per HPI unless specifically indicated above     Objective:    There were no vitals taken for this visit.  Wt Readings from Last 3 Encounters:  10/28/22 217 lb 2.5 oz (98.5 kg)  10/23/22 230 lb (104.3 kg)  06/30/22 229 lb 15 oz (104.3 kg)    Physical Exam  Results for orders placed or performed during the hospital encounter of 10/23/22  Blood culture (routine x 2)   Specimen: BLOOD  Result Value Ref Range   Specimen Description BLOOD BLOOD RIGHT ARM    Special Requests      BOTTLES DRAWN AEROBIC AND ANAEROBIC Blood Culture results may not be optimal due to an excessive volume of blood received in culture bottles   Culture      NO  GROWTH 5 DAYS Performed at [facility], [address] [place], [place] [address]    Report Status 10/28/2022 FINAL   Blood culture (routine x 2)   Specimen: BLOOD  Result Value Ref Range   Specimen Description BLOOD LEFT ANTECUBITAL    Special Requests      BOTTLES DRAWN AEROBIC AND ANAEROBIC Blood Culture adequate volume   Culture      NO GROWTH 5 DAYS Performed at [facility], [address] [place], [place] [address]    Report Status 10/28/2022 FINAL   Body fluid culture w Gram Stain   Specimen: Synovium; Body Fluid  Result Value Ref Range   Specimen Description      SYNOVIAL Performed at [facility], [address] [place], [place] [address]    Special Requests      NONE Performed at [facility], [address] [place], [place] [address]    Gram Stain      FEW WBC PRESENT, PREDOMINANTLY PMN NO ORGANISMS SEEN    Culture      NO GROWTH 3 DAYS Performed at [facility], [address] [place], [place] [address]    Report Status 10/27/2022 FINAL   Aerobic Culture w Gram Stain (superficial specimen)   Specimen: Wound  Result Value Ref Range  Specimen Description WOUND    Special Requests NONE    Gram Stain      FEW WBC PRESENT, PREDOMINANTLY MONONUCLEAR RARE GRAM POSITIVE COCCI IN CLUSTERS Performed at [facility], [address] [place], [place] [address]    Culture ABUNDANT STAPHYLOCOCCUS AUREUS    Report Status 10/29/2022 FINAL    Organism ID, Bacteria STAPHYLOCOCCUS AUREUS       Susceptibility   Staphylococcus aureus - MIC*    CIPROFLOXACIN <=0.5 SENSITIVE Sensitive     ERYTHROMYCIN <=0.25 SENSITIVE Sensitive     GENTAMICIN <=0.5 SENSITIVE Sensitive     OXACILLIN 0.5 SENSITIVE Sensitive     TETRACYCLINE <=1 SENSITIVE Sensitive     VANCOMYCIN <=0.5 SENSITIVE Sensitive     TRIMETH/SULFA <=10 SENSITIVE Sensitive     CLINDAMYCIN <=0.25 SENSITIVE Sensitive     RIFAMPIN <=0.5 SENSITIVE Sensitive      Inducible Clindamycin NEGATIVE Sensitive     LINEZOLID 2 SENSITIVE Sensitive     * ABUNDANT STAPHYLOCOCCUS AUREUS  Lactic acid, plasma  Result Value Ref Range   Lactic Acid, Venous 1.6 0.5 - 1.9 mmol/L  Lactic acid, plasma  Result Value Ref Range   Lactic Acid, Venous 1.4 0.5 - 1.9 mmol/L  CBC with Differential  Result Value Ref Range   WBC 21.8 (H) 4.0 - 10.5 K/uL   RBC 5.52 4.22 - 5.81 MIL/uL   Hemoglobin 15.0 13.0 - 17.0 g/dL   HCT 30.8 65.7 - 84.6 %   MCV 83.5 80.0 - 100.0 fL   MCH 27.2 26.0 - 34.0 pg   MCHC 32.5 30.0 - 36.0 g/dL   RDW 96.2 95.2 - 84.1 %   Platelets 210 150 - 400 K/uL   nRBC 0.0 0.0 - 0.2 %   Neutrophils Relative % 88 %   Neutro Abs 19.3 (H) 1.7 - 7.7 K/uL   Lymphocytes Relative 5 %   Lymphs Abs 1.0 0.7 - 4.0 K/uL   Monocytes Relative 6 %   Monocytes Absolute 1.4 (H) 0.1 - 1.0 K/uL   Eosinophils Relative 0 %   Eosinophils Absolute 0.0 0.0 - 0.5 K/uL   Basophils Relative 0 %   Basophils Absolute 0.0 0.0 - 0.1 K/uL   Immature Granulocytes 1 %   Abs Immature Granulocytes 0.12 (H) 0.00 - 0.07 K/uL  Comprehensive metabolic panel  Result Value Ref Range   Sodium 132 (L) 135 - 145 mmol/L   Potassium 3.8 3.5 - 5.1 mmol/L   Chloride 100 98 - 111 mmol/L   CO2 23 22 - 32 mmol/L   Glucose, Bld 132 (H) 70 - 99 mg/dL   BUN 10 6 - 20 mg/dL   Creatinine, Ser 3.24 0.61 - 1.24 mg/dL   Calcium 9.0 8.9 - 40.1 mg/dL   Total Protein 8.3 (H) 6.5 - 8.1 g/dL   Albumin 4.5 3.5 - 5.0 g/dL   AST 18 15 - 41 U/L   ALT 25 0 - 44 U/L   Alkaline Phosphatase 54 38 - 126 U/L   Total Bilirubin 1.7 (H) 0.3 - 1.2 mg/dL   GFR, Estimated >02 >72 mL/min   Anion gap 9 5 - 15  Lyme Disease Serology w/Reflex  Result Value Ref Range   Lyme Total Antibody EIA Negative Negative  Sedimentation rate  Result Value Ref Range   Sed Rate 20 (H) 0 - 15 mm/hr  C-reactive protein  Result Value Ref Range   CRP 29.5 (H) <1.0 mg/dL  Uric acid  Result Value Ref  Range   Uric Acid, Serum 4.8  3.7 - 8.6 mg/dL  HIV Antibody (routine testing w rflx)  Result Value Ref Range   HIV Screen 4th Generation wRfx Non Reactive Non Reactive  Comprehensive metabolic panel  Result Value Ref Range   Sodium 135 135 - 145 mmol/L   Potassium 3.9 3.5 - 5.1 mmol/L   Chloride 104 98 - 111 mmol/L   CO2 21 (L) 22 - 32 mmol/L   Glucose, Bld 115 (H) 70 - 99 mg/dL   BUN 10 6 - 20 mg/dL   Creatinine, Ser 1.61 0.61 - 1.24 mg/dL   Calcium 8.6 (L) 8.9 - 10.3 mg/dL   Total Protein 7.1 6.5 - 8.1 g/dL   Albumin 3.4 (L) 3.5 - 5.0 g/dL   AST 16 15 - 41 U/L   ALT 22 0 - 44 U/L   Alkaline Phosphatase 49 38 - 126 U/L   Total Bilirubin 1.3 (H) 0.3 - 1.2 mg/dL   GFR, Estimated >09 >60 mL/min   Anion gap 10 5 - 15  CBC  Result Value Ref Range   WBC 18.3 (H) 4.0 - 10.5 K/uL   RBC 4.75 4.22 - 5.81 MIL/uL   Hemoglobin 13.1 13.0 - 17.0 g/dL   HCT 45.4 (L) 09.8 - 11.9 %   MCV 80.0 80.0 - 100.0 fL   MCH 27.6 26.0 - 34.0 pg   MCHC 34.5 30.0 - 36.0 g/dL   RDW 14.7 82.9 - 56.2 %   Platelets 182 150 - 400 K/uL   nRBC 0.0 0.0 - 0.2 %  C-reactive protein  Result Value Ref Range   CRP 28.6 (H) <1.0 mg/dL  Lactic acid, plasma  Result Value Ref Range   Lactic Acid, Venous 1.0 0.5 - 1.9 mmol/L  CK  Result Value Ref Range   Total CK 50 49 - 397 U/L  Procalcitonin  Result Value Ref Range   Procalcitonin 0.17 ng/mL  Protime-INR  Result Value Ref Range   Prothrombin Time 15.2 11.4 - 15.2 seconds   INR 1.2 0.8 - 1.2  Basic metabolic panel  Result Value Ref Range   Sodium 135 135 - 145 mmol/L   Potassium 3.7 3.5 - 5.1 mmol/L   Chloride 103 98 - 111 mmol/L   CO2 26 22 - 32 mmol/L   Glucose, Bld 110 (H) 70 - 99 mg/dL   BUN 8 6 - 20 mg/dL   Creatinine, Ser 1.30 0.61 - 1.24 mg/dL   Calcium 8.3 (L) 8.9 - 10.3 mg/dL   GFR, Estimated >86 >57 mL/min   Anion gap 6 5 - 15  CBC  Result Value Ref Range   WBC 14.4 (H) 4.0 - 10.5 K/uL   RBC 4.62 4.22 - 5.81 MIL/uL   Hemoglobin 12.9 (L) 13.0 - 17.0 g/dL   HCT 84.6  (L) 96.2 - 52.0 %   MCV 81.8 80.0 - 100.0 fL   MCH 27.9 26.0 - 34.0 pg   MCHC 34.1 30.0 - 36.0 g/dL   RDW 95.2 84.1 - 32.4 %   Platelets 220 150 - 400 K/uL   nRBC 0.0 0.0 - 0.2 %  Magnesium  Result Value Ref Range   Magnesium 2.1 1.7 - 2.4 mg/dL  Basic metabolic panel  Result Value Ref Range   Sodium 134 (L) 135 - 145 mmol/L   Potassium 3.8 3.5 - 5.1 mmol/L   Chloride 101 98 - 111 mmol/L   CO2 24 22 - 32 mmol/L   Glucose, Bld 110 (  H) 70 - 99 mg/dL   BUN 8 6 - 20 mg/dL   Creatinine, Ser 8.41 0.61 - 1.24 mg/dL   Calcium 8.5 (L) 8.9 - 10.3 mg/dL   GFR, Estimated >32 >44 mL/min   Anion gap 9 5 - 15  CBC  Result Value Ref Range   WBC 12.7 (H) 4.0 - 10.5 K/uL   RBC 4.73 4.22 - 5.81 MIL/uL   Hemoglobin 13.0 13.0 - 17.0 g/dL   HCT 01.0 (L) 27.2 - 53.6 %   MCV 81.8 80.0 - 100.0 fL   MCH 27.5 26.0 - 34.0 pg   MCHC 33.6 30.0 - 36.0 g/dL   RDW 64.4 03.4 - 74.2 %   Platelets 239 150 - 400 K/uL   nRBC 0.0 0.0 - 0.2 %  Magnesium  Result Value Ref Range   Magnesium 2.4 1.7 - 2.4 mg/dL  Vancomycin, random  Result Value Ref Range   Vancomycin Rm 7 ug/mL  Basic metabolic panel  Result Value Ref Range   Sodium 134 (L) 135 - 145 mmol/L   Potassium 3.8 3.5 - 5.1 mmol/L   Chloride 101 98 - 111 mmol/L   CO2 27 22 - 32 mmol/L   Glucose, Bld 106 (H) 70 - 99 mg/dL   BUN 12 6 - 20 mg/dL   Creatinine, Ser 5.95 0.61 - 1.24 mg/dL   Calcium 8.4 (L) 8.9 - 10.3 mg/dL   GFR, Estimated >63 >87 mL/min   Anion gap 6 5 - 15  CBC  Result Value Ref Range   WBC 11.3 (H) 4.0 - 10.5 K/uL   RBC 4.59 4.22 - 5.81 MIL/uL   Hemoglobin 12.8 (L) 13.0 - 17.0 g/dL   HCT 56.4 (L) 33.2 - 95.1 %   MCV 80.4 80.0 - 100.0 fL   MCH 27.9 26.0 - 34.0 pg   MCHC 34.7 30.0 - 36.0 g/dL   RDW 88.4 16.6 - 06.3 %   Platelets 278 150 - 400 K/uL   nRBC 0.0 0.0 - 0.2 %  Magnesium  Result Value Ref Range   Magnesium 2.4 1.7 - 2.4 mg/dL  Basic metabolic panel  Result Value Ref Range   Sodium 136 135 - 145 mmol/L    Potassium 4.0 3.5 - 5.1 mmol/L   Chloride 101 98 - 111 mmol/L   CO2 25 22 - 32 mmol/L   Glucose, Bld 103 (H) 70 - 99 mg/dL   BUN 11 6 - 20 mg/dL   Creatinine, Ser 0.16 0.61 - 1.24 mg/dL   Calcium 8.4 (L) 8.9 - 10.3 mg/dL   GFR, Estimated >01 >09 mL/min   Anion gap 10 5 - 15  CBC  Result Value Ref Range   WBC 11.5 (H) 4.0 - 10.5 K/uL   RBC 4.76 4.22 - 5.81 MIL/uL   Hemoglobin 12.9 (L) 13.0 - 17.0 g/dL   HCT 32.3 (L) 55.7 - 32.2 %   MCV 81.7 80.0 - 100.0 fL   MCH 27.1 26.0 - 34.0 pg   MCHC 33.2 30.0 - 36.0 g/dL   RDW 02.5 42.7 - 06.2 %   Platelets 318 150 - 400 K/uL   nRBC 0.0 0.0 - 0.2 %  Magnesium  Result Value Ref Range   Magnesium 2.5 (H) 1.7 - 2.4 mg/dL  Basic metabolic panel  Result Value Ref Range   Sodium 139 135 - 145 mmol/L   Potassium 4.0 3.5 - 5.1 mmol/L   Chloride 102 98 - 111 mmol/L   CO2 23 22 -  32 mmol/L   Glucose, Bld 112 (H) 70 - 99 mg/dL   BUN 12 6 - 20 mg/dL   Creatinine, Ser 7.10 0.61 - 1.24 mg/dL   Calcium 8.8 (L) 8.9 - 10.3 mg/dL   GFR, Estimated >62 >69 mL/min   Anion gap 14 5 - 15  CBC  Result Value Ref Range   WBC 12.6 (H) 4.0 - 10.5 K/uL   RBC 4.66 4.22 - 5.81 MIL/uL   Hemoglobin 12.8 (L) 13.0 - 17.0 g/dL   HCT 48.5 (L) 46.2 - 70.3 %   MCV 81.1 80.0 - 100.0 fL   MCH 27.5 26.0 - 34.0 pg   MCHC 33.9 30.0 - 36.0 g/dL   RDW 50.0 93.8 - 18.2 %   Platelets 343 150 - 400 K/uL   nRBC 0.0 0.0 - 0.2 %  Magnesium  Result Value Ref Range   Magnesium 2.3 1.7 - 2.4 mg/dL      Assessment & Plan:   Problem List Items Addressed This Visit   None    Follow up plan: No follow-ups on file.

## 2023-01-04 ENCOUNTER — Ambulatory Visit: Payer: 59 | Admitting: Nurse Practitioner

## 2023-04-05 ENCOUNTER — Emergency Department
Admission: EM | Admit: 2023-04-05 | Discharge: 2023-04-05 | Discharge status: Absent without leave | Payer: 59 | Source: Home / Self Care

## 2023-05-16 NOTE — Progress Notes (Deleted)
 There were no vitals taken for this visit.   Subjective:    Patient ID: Jason Mason, male    DOB: [birth year], 38 y.o.   MRN: [id number]  HPI: Jason Mason is a 38 y.o. male  No chief complaint on file.  Patient presents to clinic to establish care with new PCP.  Introduced to Publishing rights manager role and practice setting.  All questions answered.  Discussed provider/patient relationship and expectations.  Patient reports a history of ***. Patient denies a history of: Hypertension, Elevated Cholesterol, Diabetes, Thyroid problems, Depression, Anxiety, Neurological problems, and Abdominal problems.   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   Cellulitis of left knee 10/29/2022   Resolved Ambulatory Problems    Diagnosis Date Noted   No Resolved Ambulatory Problems   No Additional Past Medical History   Past Surgical History:  Procedure Laterality Date   ANTERIOR CRUCIATE LIGAMENT REPAIR Right    Family History  Problem Relation Age of Onset   Diabetes Mother    Diabetes Father    Hypertension Father    Cancer Maternal Grandmother        bone marrow cancer   Cancer Maternal Grandfather        bone marrow cancer     Review of Systems  Per HPI unless specifically indicated above     Objective:    There were no vitals taken for this visit.  Wt Readings from Last 3 Encounters:  10/28/22 217 lb 2.5 oz (98.5 kg)  10/23/22 230 lb (104.3 kg)  06/30/22 229 lb 15 oz (104.3 kg)    Physical Exam  Results for orders placed or performed during the hospital encounter of 10/23/22  Blood culture (routine x 2)   Collection Time: 10/23/22  2:24 PM   Specimen: BLOOD  Result Value Ref Range   Specimen Description BLOOD BLOOD RIGHT ARM    Special Requests      BOTTLES DRAWN AEROBIC AND ANAEROBIC Blood Culture results may not be optimal due to an excessive volume of blood received in  culture bottles   Culture      NO GROWTH 5 DAYS Performed at [facility], [address] [place], [place] [address]    Report Status 10/28/2022 FINAL   Lactic acid, plasma   Collection Time: 10/23/22  2:24 PM  Result Value Ref Range   Lactic Acid, Venous 1.6 0.5 - 1.9 mmol/L  CBC with Differential   Collection Time: 10/23/22  2:24 PM  Result Value Ref Range   WBC 21.8 (H) 4.0 - 10.5 K/uL   RBC 5.52 4.22 - 5.81 MIL/uL   Hemoglobin 15.0 13.0 - 17.0 g/dL   HCT 19.1 47.8 - 29.5 %   MCV 83.5 80.0 - 100.0 fL   MCH 27.2 26.0 - 34.0 pg   MCHC 32.5 30.0 - 36.0 g/dL   RDW 62.1 30.8 - 65.7 %   Platelets 210 150 - 400 K/uL   nRBC 0.0 0.0 - 0.2 %   Neutrophils Relative % 88 %   Neutro Abs 19.3 (H) 1.7 - 7.7 K/uL   Lymphocytes Relative 5 %   Lymphs Abs 1.0 0.7 - 4.0 K/uL   Monocytes Relative 6 %   Monocytes Absolute 1.4 (H) 0.1 - 1.0 K/uL   Eosinophils Relative 0 %   Eosinophils Absolute 0.0 0.0 - 0.5 K/uL   Basophils Relative 0 %   Basophils Absolute  0.0 0.0 - 0.1 K/uL   Immature Granulocytes 1 %   Abs Immature Granulocytes 0.12 (H) 0.00 - 0.07 K/uL  Comprehensive metabolic panel   Collection Time: 10/23/22  2:24 PM  Result Value Ref Range   Sodium 132 (L) 135 - 145 mmol/L   Potassium 3.8 3.5 - 5.1 mmol/L   Chloride 100 98 - 111 mmol/L   CO2 23 22 - 32 mmol/L   Glucose, Bld 132 (H) 70 - 99 mg/dL   BUN 10 6 - 20 mg/dL   Creatinine, Ser 1.61 0.61 - 1.24 mg/dL   Calcium 9.0 8.9 - 09.6 mg/dL   Total Protein 8.3 (H) 6.5 - 8.1 g/dL   Albumin 4.5 3.5 - 5.0 g/dL   AST 18 15 - 41 U/L   ALT 25 0 - 44 U/L   Alkaline Phosphatase 54 38 - 126 U/L   Total Bilirubin 1.7 (H) 0.3 - 1.2 mg/dL   GFR, Estimated >04 >54 mL/min   Anion gap 9 5 - 15  Uric acid   Collection Time: 10/23/22  2:24 PM  Result Value Ref Range   Uric Acid, Serum 4.8 3.7 - 8.6 mg/dL  Lactic acid, plasma   Collection Time: 10/23/22  3:29 PM  Result Value Ref Range   Lactic Acid, Venous 1.4 0.5 - 1.9 mmol/L   Blood culture (routine x 2)   Collection Time: 10/23/22  3:30 PM   Specimen: BLOOD  Result Value Ref Range   Specimen Description BLOOD LEFT ANTECUBITAL    Special Requests      BOTTLES DRAWN AEROBIC AND ANAEROBIC Blood Culture adequate volume   Culture      NO GROWTH 5 DAYS Performed at [facility], [address] [place], [place] [address]    Report Status 10/28/2022 FINAL   Body fluid culture w Gram Stain   Collection Time: 10/23/22  3:30 PM   Specimen: Synovium; Body Fluid  Result Value Ref Range   Specimen Description      SYNOVIAL Performed at [facility], [address] [place], [place] [address]    Special Requests      NONE Performed at [facility], [address] [place], [place] [address]    Gram Stain      FEW WBC PRESENT, PREDOMINANTLY PMN NO ORGANISMS SEEN    Culture      NO GROWTH 3 DAYS Performed at [facility], [address] [place], [place] [address]    Report Status 10/27/2022 FINAL   Lyme Disease Serology w/Reflex   Collection Time: 10/23/22  3:30 PM  Result Value Ref Range   Lyme Total Antibody EIA Negative Negative  Sedimentation rate   Collection Time: 10/23/22  3:30 PM  Result Value Ref Range   Sed Rate 20 (H) 0 - 15 mm/hr  C-reactive protein   Collection Time: 10/23/22  3:30 PM  Result Value Ref Range   CRP 29.5 (H) <1.0 mg/dL  CK   Collection Time: 10/23/22  3:39 PM  Result Value Ref Range   Total CK 50 49 - 397 U/L  Procalcitonin   Collection Time: 10/23/22  3:39 PM  Result Value Ref Range   Procalcitonin 0.17 ng/mL  Lactic acid, plasma   Collection Time: 10/23/22  6:05 PM  Result Value Ref Range   Lactic Acid, Venous 1.0 0.5 - 1.9 mmol/L  HIV Antibody (routine testing w rflx)   Collection Time: 10/23/22  6:06 PM  Result Value Ref Range  HIV Screen 4th Generation wRfx Non Reactive Non Reactive  C-reactive protein   Collection Time: 10/23/22  6:06 PM  Result Value Ref Range   CRP  28.6 (H) <1.0 mg/dL  Comprehensive metabolic panel   Collection Time: 10/24/22  4:46 AM  Result Value Ref Range   Sodium 135 135 - 145 mmol/L   Potassium 3.9 3.5 - 5.1 mmol/L   Chloride 104 98 - 111 mmol/L   CO2 21 (L) 22 - 32 mmol/L   Glucose, Bld 115 (H) 70 - 99 mg/dL   BUN 10 6 - 20 mg/dL   Creatinine, Ser 1.61 0.61 - 1.24 mg/dL   Calcium 8.6 (L) 8.9 - 10.3 mg/dL   Total Protein 7.1 6.5 - 8.1 g/dL   Albumin 3.4 (L) 3.5 - 5.0 g/dL   AST 16 15 - 41 U/L   ALT 22 0 - 44 U/L   Alkaline Phosphatase 49 38 - 126 U/L   Total Bilirubin 1.3 (H) 0.3 - 1.2 mg/dL   GFR, Estimated >09 >60 mL/min   Anion gap 10 5 - 15  CBC   Collection Time: 10/24/22  4:46 AM  Result Value Ref Range   WBC 18.3 (H) 4.0 - 10.5 K/uL   RBC 4.75 4.22 - 5.81 MIL/uL   Hemoglobin 13.1 13.0 - 17.0 g/dL   HCT 45.4 (L) 09.8 - 11.9 %   MCV 80.0 80.0 - 100.0 fL   MCH 27.6 26.0 - 34.0 pg   MCHC 34.5 30.0 - 36.0 g/dL   RDW 14.7 82.9 - 56.2 %   Platelets 182 150 - 400 K/uL   nRBC 0.0 0.0 - 0.2 %  Protime-INR   Collection Time: 10/24/22  4:46 AM  Result Value Ref Range   Prothrombin Time 15.2 11.4 - 15.2 seconds   INR 1.2 0.8 - 1.2  Basic metabolic panel   Collection Time: 10/25/22  4:45 AM  Result Value Ref Range   Sodium 135 135 - 145 mmol/L   Potassium 3.7 3.5 - 5.1 mmol/L   Chloride 103 98 - 111 mmol/L   CO2 26 22 - 32 mmol/L   Glucose, Bld 110 (H) 70 - 99 mg/dL   BUN 8 6 - 20 mg/dL   Creatinine, Ser 1.30 0.61 - 1.24 mg/dL   Calcium 8.3 (L) 8.9 - 10.3 mg/dL   GFR, Estimated >86 >57 mL/min   Anion gap 6 5 - 15  CBC   Collection Time: 10/25/22  4:45 AM  Result Value Ref Range   WBC 14.4 (H) 4.0 - 10.5 K/uL   RBC 4.62 4.22 - 5.81 MIL/uL   Hemoglobin 12.9 (L) 13.0 - 17.0 g/dL   HCT 84.6 (L) 96.2 - 95.2 %   MCV 81.8 80.0 - 100.0 fL   MCH 27.9 26.0 - 34.0 pg   MCHC 34.1 30.0 - 36.0 g/dL   RDW 84.1 32.4 - 40.1 %   Platelets 220 150 - 400 K/uL   nRBC 0.0 0.0 - 0.2 %  Magnesium   Collection Time:  10/25/22  4:45 AM  Result Value Ref Range   Magnesium 2.1 1.7 - 2.4 mg/dL  Vancomycin, random   Collection Time: 10/26/22  3:18 AM  Result Value Ref Range   Vancomycin Rm 7 ug/mL  Basic metabolic panel   Collection Time: 10/26/22  4:18 AM  Result Value Ref Range   Sodium 134 (L) 135 - 145 mmol/L   Potassium 3.8 3.5 - 5.1 mmol/L   Chloride 101 98 - 111  mmol/L   CO2 24 22 - 32 mmol/L   Glucose, Bld 110 (H) 70 - 99 mg/dL   BUN 8 6 - 20 mg/dL   Creatinine, Ser 4.09 0.61 - 1.24 mg/dL   Calcium 8.5 (L) 8.9 - 10.3 mg/dL   GFR, Estimated >81 >19 mL/min   Anion gap 9 5 - 15  CBC   Collection Time: 10/26/22  4:18 AM  Result Value Ref Range   WBC 12.7 (H) 4.0 - 10.5 K/uL   RBC 4.73 4.22 - 5.81 MIL/uL   Hemoglobin 13.0 13.0 - 17.0 g/dL   HCT 14.7 (L) 82.9 - 56.2 %   MCV 81.8 80.0 - 100.0 fL   MCH 27.5 26.0 - 34.0 pg   MCHC 33.6 30.0 - 36.0 g/dL   RDW 13.0 86.5 - 78.4 %   Platelets 239 150 - 400 K/uL   nRBC 0.0 0.0 - 0.2 %  Magnesium   Collection Time: 10/26/22  4:18 AM  Result Value Ref Range   Magnesium 2.4 1.7 - 2.4 mg/dL  Aerobic Culture w Gram Stain (superficial specimen)   Collection Time: 10/26/22  2:34 PM   Specimen: Wound  Result Value Ref Range   Specimen Description WOUND    Special Requests NONE    Gram Stain      FEW WBC PRESENT, PREDOMINANTLY MONONUCLEAR RARE GRAM POSITIVE COCCI IN CLUSTERS Performed at [facility], [address] [place], [place] [address]    Culture ABUNDANT STAPHYLOCOCCUS AUREUS    Report Status 10/29/2022 FINAL    Organism ID, Bacteria STAPHYLOCOCCUS AUREUS       Susceptibility   Staphylococcus aureus - MIC*    CIPROFLOXACIN <=0.5 SENSITIVE Sensitive     ERYTHROMYCIN <=0.25 SENSITIVE Sensitive     GENTAMICIN <=0.5 SENSITIVE Sensitive     OXACILLIN 0.5 SENSITIVE Sensitive     TETRACYCLINE <=1 SENSITIVE Sensitive     VANCOMYCIN <=0.5 SENSITIVE Sensitive     TRIMETH/SULFA <=10 SENSITIVE Sensitive     CLINDAMYCIN <=0.25 SENSITIVE  Sensitive     RIFAMPIN <=0.5 SENSITIVE Sensitive     Inducible Clindamycin NEGATIVE Sensitive     LINEZOLID 2 SENSITIVE Sensitive     * ABUNDANT STAPHYLOCOCCUS AUREUS  Basic metabolic panel   Collection Time: 10/27/22  4:03 AM  Result Value Ref Range   Sodium 134 (L) 135 - 145 mmol/L   Potassium 3.8 3.5 - 5.1 mmol/L   Chloride 101 98 - 111 mmol/L   CO2 27 22 - 32 mmol/L   Glucose, Bld 106 (H) 70 - 99 mg/dL   BUN 12 6 - 20 mg/dL   Creatinine, Ser 5.28 0.61 - 1.24 mg/dL   Calcium 8.4 (L) 8.9 - 10.3 mg/dL   GFR, Estimated >41 >32 mL/min   Anion gap 6 5 - 15  CBC   Collection Time: 10/27/22  4:03 AM  Result Value Ref Range   WBC 11.3 (H) 4.0 - 10.5 K/uL   RBC 4.59 4.22 - 5.81 MIL/uL   Hemoglobin 12.8 (L) 13.0 - 17.0 g/dL   HCT 44.0 (L) 10.2 - 72.5 %   MCV 80.4 80.0 - 100.0 fL   MCH 27.9 26.0 - 34.0 pg   MCHC 34.7 30.0 - 36.0 g/dL   RDW 36.6 44.0 - 34.7 %   Platelets 278 150 - 400 K/uL   nRBC 0.0 0.0 - 0.2 %  Magnesium   Collection Time: 10/27/22  4:03 AM  Result Value Ref Range   Magnesium 2.4 1.7 -  2.4 mg/dL  Basic metabolic panel   Collection Time: 10/28/22  2:20 AM  Result Value Ref Range   Sodium 136 135 - 145 mmol/L   Potassium 4.0 3.5 - 5.1 mmol/L   Chloride 101 98 - 111 mmol/L   CO2 25 22 - 32 mmol/L   Glucose, Bld 103 (H) 70 - 99 mg/dL   BUN 11 6 - 20 mg/dL   Creatinine, Ser 0.98 0.61 - 1.24 mg/dL   Calcium 8.4 (L) 8.9 - 10.3 mg/dL   GFR, Estimated >11 >91 mL/min   Anion gap 10 5 - 15  CBC   Collection Time: 10/28/22  2:20 AM  Result Value Ref Range   WBC 11.5 (H) 4.0 - 10.5 K/uL   RBC 4.76 4.22 - 5.81 MIL/uL   Hemoglobin 12.9 (L) 13.0 - 17.0 g/dL   HCT 47.8 (L) 29.5 - 62.1 %   MCV 81.7 80.0 - 100.0 fL   MCH 27.1 26.0 - 34.0 pg   MCHC 33.2 30.0 - 36.0 g/dL   RDW 30.8 65.7 - 84.6 %   Platelets 318 150 - 400 K/uL   nRBC 0.0 0.0 - 0.2 %  Magnesium   Collection Time: 10/28/22  2:20 AM  Result Value Ref Range   Magnesium 2.5 (H) 1.7 - 2.4 mg/dL  Basic  metabolic panel   Collection Time: 10/29/22  5:32 AM  Result Value Ref Range   Sodium 139 135 - 145 mmol/L   Potassium 4.0 3.5 - 5.1 mmol/L   Chloride 102 98 - 111 mmol/L   CO2 23 22 - 32 mmol/L   Glucose, Bld 112 (H) 70 - 99 mg/dL   BUN 12 6 - 20 mg/dL   Creatinine, Ser 9.62 0.61 - 1.24 mg/dL   Calcium 8.8 (L) 8.9 - 10.3 mg/dL   GFR, Estimated >95 >28 mL/min   Anion gap 14 5 - 15  CBC   Collection Time: 10/29/22  5:32 AM  Result Value Ref Range   WBC 12.6 (H) 4.0 - 10.5 K/uL   RBC 4.66 4.22 - 5.81 MIL/uL   Hemoglobin 12.8 (L) 13.0 - 17.0 g/dL   HCT 41.3 (L) 24.4 - 01.0 %   MCV 81.1 80.0 - 100.0 fL   MCH 27.5 26.0 - 34.0 pg   MCHC 33.9 30.0 - 36.0 g/dL   RDW 27.2 53.6 - 64.4 %   Platelets 343 150 - 400 K/uL   nRBC 0.0 0.0 - 0.2 %  Magnesium   Collection Time: 10/29/22  5:32 AM  Result Value Ref Range   Magnesium 2.3 1.7 - 2.4 mg/dL      Assessment & Plan:   Problem List Items Addressed This Visit   None    Follow up plan: No follow-ups on file.

## 2023-05-17 ENCOUNTER — Ambulatory Visit: Payer: 59 | Admitting: Nurse Practitioner

## 2023-05-17 DIAGNOSIS — Z7689 Persons encountering health services in other specified circumstances: Secondary | ICD-10-CM

## 2023-11-11 ENCOUNTER — Encounter: Payer: Self-pay | Admitting: Nurse Practitioner

## 2023-11-11 ENCOUNTER — Ambulatory Visit: Payer: Self-pay | Admitting: Nurse Practitioner

## 2023-11-11 DIAGNOSIS — Z113 Encounter for screening for infections with a predominantly sexual mode of transmission: Secondary | ICD-10-CM

## 2023-11-11 LAB — HM HIV SCREENING LAB: HM HIV Screening: NEGATIVE

## 2023-11-11 NOTE — Progress Notes (Signed)
 [facility] STI clinic [address] [place] [address] Main phone: [phone]  STI screening visit  Subjective:  Jason Mason is a 38 y.o. male being seen today for an STI screening visit. The patient reports they do not have symptoms.    Patient has the following medical conditions:  Patient Active Problem List   Diagnosis Date Noted   Cellulitis of left knee 10/29/2022   MDD (major depressive disorder), recurrent episode, mild (HCC) 07/31/2020   Chief Complaint  Patient presents with   SEXUALLY TRANSMITTED DISEASE    HPI Patient reports 3 male partners in the past 3 months. He denies symptoms.  See flowsheet for further details and programmatic requirements  Hyperlink available at the top of the signed note in blue.  Flow sheet content below:  Pregnancy Intention Screening Does the patient want to become pregnant in the next year?: Yes Does the patient's partner want to become pregnant in the next year?: Yes Does the patient currently take folic acid or women's MVI, or a prenatal viitamin?: Promoted daily consumption of MVI with folic acid if capable of conceiving Does the patient or their partner want to learn more about planning a healthy pregnancy?: Provided Preconception Counseling Would the patient like to discuss contraceptive options today?: N/A All Patients Anyone smoke around pt and/or pt's children?: No Anyone smoke inside pt's house?: No Anyone smoke inside car?: No Anyone smoke inside the workplace?: No Reason For STD Screen STD Screening: Is asymptomatic Have you ever had an STD?: Yes History of Antibiotic use in the past 2 weeks?: No STD Symptoms Denies all: Yes Risk Factors for Hep B Household, sexual, or needle sharing contact of a person infected with Hep B: No Sexual contact with a person who uses drugs not as prescribed?: No Currently or Ever used drugs not as prescribed: No HIV  Positive: No PRep Patient: No Men who have sex with men: No Have Hepatitis C: No History of Incarceration: No History of Homeslessness?: No Anal sex following anal drug use?: No Risk Factors for Hep C Currently using drugs not as prescribed: No Sexual partner(s) currently using drugs as not prescribed: No History of drug use: No HIV Positive: No People with a history of incarceration: No People born between the years of 53 and 52: No Abuse History Has patient ever been abused physically?: No Has patient ever been abused sexually?: No Does patient feel they have a problem with Anxiety?: No Does patient feel they have a problem with Depression?: No Counseling Patient counseled to use condoms with all sex: Condoms declined RTC in 2-3 weeks for test results: Yes Clinic will call if test results abnormal before test result appt.: Yes Test results given to patient Patient counseled to use condoms with all sex: Condoms declined  Screening for MPX risk: Does the patient have an unexplained rash? No Is the patient MSM? No Does the patient endorse multiple sex partners or anonymous sex partners? No Did the patient have close or sexual contact with a person diagnosed with MPX? No Has the patient traveled outside the [place]  where MPX is endemic? No Is there a high clinical suspicion for MPX-- evidenced by one of the following No  -Unlikely to be chickenpox  -Lymphadenopathy  -Rash that present in same phase of evolution on any given body part  STI screening history: Last HIV test per patient/review of record was No results found for: [facility]  Lab Results  Component Value Date  HIV Non Reactive 10/23/2022    Last HEPC test per patient/review of record was No results found for: HMHEPCSCREEN No components found for: HEPC   Last HEPB test per patient/review of record was No components found for: HMHEPBSCREEN   Fertility: Does the patient or their partner desires a  pregnancy in the next year? Yes   There is no immunization history on file for this patient.  The following portions of the patient's history were reviewed and updated as appropriate: allergies, current medications, past medical history, past social history, past surgical history and problem list.  Objective:  There were no vitals filed for this visit.  Physical Exam Vitals and nursing note reviewed.  Constitutional:      Appearance: Normal appearance.  HENT:     Head: Normocephalic and atraumatic.     Mouth/Throat:     Mouth: Mucous membranes are moist.     Pharynx: No oropharyngeal exudate or posterior oropharyngeal erythema.  Eyes:     General:        Right eye: No discharge.        Left eye: No discharge.     Conjunctiva/sclera:     Right eye: Right conjunctiva is not injected. No exudate.    Left eye: Left conjunctiva is not injected. No exudate. Pulmonary:     Effort: Pulmonary effort is normal.  Abdominal:     General: Abdomen is flat.     Palpations: Abdomen is soft. There is no hepatomegaly or mass.     Tenderness: There is no abdominal tenderness. There is no rebound.  Genitourinary:    Comments: Declined genital exam- asymptomatic Lymphadenopathy:     Cervical: No cervical adenopathy.     Upper Body:     Right upper body: No supraclavicular or axillary adenopathy.     Left upper body: No supraclavicular or axillary adenopathy.  Skin:    General: Skin is warm and dry.     Findings: Rash (left forearm. Pt reports from poison ivy) present.  Neurological:     Mental Status: He is alert and oriented to person, place, and time.  Psychiatric:        Mood and Affect: Mood normal.        Behavior: Behavior normal.      Assessment and Plan:  Jason Mason is a 38 y.o. male presenting to the [facility] Department for STI screening  1. Screening for venereal disease (Primary) Encouraged him to encourage his partner to take PNV and rec healthy  lifestyle prior to pregnancy  - Syphilis Serology, [facility] - HIV  LAB - Chlamydia/GC NAA, Confirmation - Gonococcus culture - oral   Patient does not have STI symptoms Patient accepted the following screenings: oral GC culture, urine CT/GC, HIV, and RPR Patient meets criteria for HepB screening? No. Ordered? no Patient meets criteria for HepC screening? No. Ordered? no Recommended condom use with sex with partners that are not trying to become pregnant. Discussed importance of condom use for STI prevention  Treat positive test results per standing order. Discussed time line for [facility] results and that patient will be called with positive results and encouraged patient to call if he had not heard in 2 weeks Recommended repeat testing in 3 months with positive results. Recommended returning for continued or worsening symptoms.   Return if symptoms worsen or fail to improve.  No future appointments.  Jason Mason K Merilyn Pagan, NP

## 2023-11-16 LAB — GONOCOCCUS CULTURE

## 2023-11-16 LAB — CHLAMYDIA/GC NAA, CONFIRMATION
Chlamydia trachomatis, NAA: NEGATIVE
Neisseria gonorrhoeae, NAA: NEGATIVE

## 2023-11-17 ENCOUNTER — Encounter: Payer: Self-pay | Admitting: Nurse Practitioner

## 2023-11-18 ENCOUNTER — Ambulatory Visit: Payer: Self-pay

## 2023-12-09 NOTE — Addendum Note (Signed)
 Addended by: Montgomery Rothlisberger on: 12/09/2023 11:37 AM   Modules accepted: Orders
